# Patient Record
Sex: Female | Born: 1960 | Race: White | Hispanic: No | Marital: Married | Smoking: Never smoker
Health system: Southern US, Community
[De-identification: ages and names within clinical notes are randomized; demographics above are authoritative.]

## PROBLEM LIST (undated history)

## (undated) DIAGNOSIS — B350 Tinea barbae and tinea capitis: Secondary | ICD-10-CM

## (undated) DIAGNOSIS — G4730 Sleep apnea, unspecified: Secondary | ICD-10-CM

## (undated) HISTORY — PX: TUBAL LIGATION: SHX77

## (undated) HISTORY — DX: Sleep apnea, unspecified: G47.30

## (undated) HISTORY — DX: Tinea barbae and tinea capitis: B35.0

---
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
## 2005-11-09 ENCOUNTER — Ambulatory Visit: Payer: Self-pay | Admitting: Unknown Physician Specialty

## 2008-03-10 ENCOUNTER — Ambulatory Visit: Payer: Self-pay | Admitting: Unknown Physician Specialty

## 2008-12-18 LAB — HM COLONOSCOPY: HM Colonoscopy: NORMAL

## 2009-11-27 HISTORY — PX: BREAST SURGERY: SHX581

## 2009-12-24 ENCOUNTER — Ambulatory Visit: Payer: Self-pay | Admitting: Unknown Physician Specialty

## 2011-01-06 ENCOUNTER — Ambulatory Visit: Payer: Self-pay | Admitting: Unknown Physician Specialty

## 2011-01-06 LAB — HM COLONOSCOPY

## 2011-10-19 LAB — HM PAP SMEAR: HM Pap smear: NORMAL

## 2012-11-27 HISTORY — PX: REDUCTION MAMMAPLASTY: SUR839

## 2012-12-18 ENCOUNTER — Ambulatory Visit (INDEPENDENT_AMBULATORY_CARE_PROVIDER_SITE_OTHER): Payer: BC Managed Care – PPO | Admitting: Internal Medicine

## 2012-12-18 ENCOUNTER — Encounter: Payer: Self-pay | Admitting: Internal Medicine

## 2012-12-18 VITALS — BP 118/78 | HR 85 | Temp 97.6°F | Resp 16 | Ht 66.0 in | Wt 142.8 lb

## 2012-12-18 DIAGNOSIS — N951 Menopausal and female climacteric states: Secondary | ICD-10-CM

## 2012-12-18 DIAGNOSIS — Z1331 Encounter for screening for depression: Secondary | ICD-10-CM

## 2012-12-18 DIAGNOSIS — Z860100 Personal history of colon polyps, unspecified: Secondary | ICD-10-CM

## 2012-12-18 DIAGNOSIS — N390 Urinary tract infection, site not specified: Secondary | ICD-10-CM

## 2012-12-18 DIAGNOSIS — Z1239 Encounter for other screening for malignant neoplasm of breast: Secondary | ICD-10-CM

## 2012-12-18 MED ORDER — NITROFURANTOIN MONOHYD MACRO 100 MG PO CAPS: 100.0000 mg | ORAL_CAPSULE | Freq: Two times a day (BID) | ORAL | Status: DC

## 2012-12-18 NOTE — Patient Instructions (Addendum)
Return an untreated urine specimen next tine you have symptoms   Make an appt for a physical with GYN exam and fasting labs same day

## 2012-12-18 NOTE — Progress Notes (Signed)
Patient ID: Latasha Rios, female   DOB: 08-07-1961, 52 y.o.   MRN: [id number]   Patient Active Problem List  Diagnosis  . Perimenopausal symptoms  . Recurrent UTI    Subjective:  CC:   Chief Complaint  Patient presents with  . Establish Care    HPI:   Latasha Rios is a 52 y.o. female who presents as a new patient to establish primary care with the chief complaint of  Recurrent UTIs.  She has had 2 UTIs since  August.  No history of UTIs for generations.  No cultures done .  No changes in soaps, detergents, or bed partners.  Empties bladder (usually) before intercourse. She is perimenopausal,  periods have been irregular, but occurring regularly  for the last 6 months.  some vaginal dryness noted.   2) reated for chostochondral tear (chest Pain, stabbing quality, brought on with supine position,  Occurred after a day of  moving heavy furniture) by [facility].    History reviewed. No pertinent past medical history.  Past Surgical History  Procedure Date  . Breast surgery 2011    Reduction  . Tonsillectomy and adenoidectomy 1965  . Tubal ligation     Family History  Problem Relation Age of Onset  . Hypertension Father   . Heart disease Father   . Hypertension Paternal Grandmother   . Diabetes Paternal Grandmother   . Hypertension Paternal Grandfather   . Diabetes Paternal Grandfather     History   Social History  . Marital Status: Married    Spouse Name: N/A    Number of Children: N/A  . Years of Education: N/A   Occupational History  . Not on file.   Social History Main Topics  . Smoking status: Never Smoker   . Smokeless tobacco: Not on file  . Alcohol Use: No  . Drug Use: No  . Sexually Active:    Other Topics Concern  . Not on file   Social History Narrative  . No narrative on file    No Known Allergies   Review of Systems:  Patient denies headache, fevers, malaise, unintentional weight loss, skin rash, eye pain, sinus congestion  and sinus pain, sore throat, dysphagia,  hemoptysis , cough, dyspnea, wheezing, chest pain, palpitations, orthopnea, edema, abdominal pain, nausea, melena, diarrhea, constipation, flank pain, dysuria, hematuria, urinary  Frequency, nocturia, numbness, tingling, seizures,  Focal weakness, Loss of consciousness,  Tremor, insomnia, depression, anxiety, and suicidal ideation.     Objective:  BP 118/78  Pulse 85  Temp 97.6 F (36.4 C) (Oral)  Resp 16  Ht 5\' 6"  (1.676 m)  Wt 142 lb 12 oz (64.751 kg)  BMI 23.04 kg/m2  SpO2 98%  LMP 12/16/2012  General appearance: alert, cooperative and appears stated age Ears: normal TM's and external ear canals both ears Throat: lips, mucosa, and tongue normal; teeth and gums normal Neck: no adenopathy, no carotid bruit, supple, symmetrical, trachea midline and thyroid not enlarged, symmetric, no tenderness/mass/nodules Back: symmetric, no curvature. ROM normal. No CVA tenderness. Lungs: clear to auscultation bilaterally Heart: regular rate and rhythm, S1, S2 normal, no murmur, click, rub or gallop Abdomen: soft, non-tender; bowel sounds normal; no masses,  no organomegaly Pulses: 2+ and symmetric Skin: Skin color, texture, turgor normal. No rashes or lesions Lymph nodes: Cervical, supraclavicular, and axillary nodes normal.  Assessment and Plan:  Perimenopausal symptoms Not sure if her recent UTIS were due to vaginal dryness since no culture was apparently done, and the second  time she started medication before testing.  Will return for pelvic exam. .   Recurrent UTI Patient given sterile container to collect pre treatment specimen. rx for antibiotic given.    Updated Medication List Outpatient Encounter Prescriptions as of 12/18/2012  Medication Sig Dispense Refill  . nitrofurantoin, macrocrystal-monohydrate, (MACROBID) 100 MG capsule Take 1 capsule (100 mg total) by mouth 2 (two) times daily.  5 capsule  10  . [DISCONTINUED] nitrofurantoin,  macrocrystal-monohydrate, (MACROBID) 100 MG capsule Take 100 mg by mouth 2 (two) times daily.          Orders Placed This Encounter  Procedures  . MM Digital Screening  . HM PAP SMEAR  . HM COLONOSCOPY    No Follow-up on file.

## 2012-12-20 ENCOUNTER — Encounter: Payer: Self-pay | Admitting: Internal Medicine

## 2012-12-20 DIAGNOSIS — Z86010 Personal history of colonic polyps: Secondary | ICD-10-CM | POA: Insufficient documentation

## 2012-12-20 DIAGNOSIS — N951 Menopausal and female climacteric states: Secondary | ICD-10-CM | POA: Insufficient documentation

## 2012-12-20 DIAGNOSIS — N390 Urinary tract infection, site not specified: Secondary | ICD-10-CM | POA: Insufficient documentation

## 2012-12-20 NOTE — Assessment & Plan Note (Signed)
Patient given sterile container to collect pre treatment specimen. rx for antibiotic given.

## 2012-12-20 NOTE — Assessment & Plan Note (Signed)
Not sure if her recent UTIS were due to vaginal dryness since no culture was apparently done, and the second time she started medication before testing.  Will return for pelvic exam. .

## 2013-01-08 ENCOUNTER — Ambulatory Visit: Payer: Self-pay | Admitting: Internal Medicine

## 2013-01-10 LAB — HM MAMMOGRAPHY: HM MAMMO: NORMAL

## 2013-01-29 ENCOUNTER — Encounter: Payer: Self-pay | Admitting: Internal Medicine

## 2013-01-31 ENCOUNTER — Encounter: Payer: BC Managed Care – PPO | Admitting: Internal Medicine

## 2013-02-25 ENCOUNTER — Ambulatory Visit (INDEPENDENT_AMBULATORY_CARE_PROVIDER_SITE_OTHER): Payer: BC Managed Care – PPO | Admitting: Internal Medicine

## 2013-02-25 ENCOUNTER — Encounter: Payer: Self-pay | Admitting: Internal Medicine

## 2013-02-25 ENCOUNTER — Other Ambulatory Visit (HOSPITAL_COMMUNITY)
Admission: RE | Admit: 2013-02-25 | Discharge: 2013-02-25 | Disposition: A | Discharge status: Home / Self Care | Payer: BC Managed Care – PPO | Source: Ambulatory Visit | Attending: Internal Medicine | Admitting: Internal Medicine

## 2013-02-25 VITALS — BP 110/68 | HR 79 | Temp 97.9°F | Ht 66.0 in | Wt 139.0 lb

## 2013-02-25 DIAGNOSIS — Z01419 Encounter for gynecological examination (general) (routine) without abnormal findings: Secondary | ICD-10-CM | POA: Insufficient documentation

## 2013-02-25 DIAGNOSIS — Z0000 Encounter for general adult medical examination without abnormal findings: Secondary | ICD-10-CM

## 2013-02-25 DIAGNOSIS — Z860100 Personal history of colon polyps, unspecified: Secondary | ICD-10-CM

## 2013-02-25 DIAGNOSIS — Z124 Encounter for screening for malignant neoplasm of cervix: Secondary | ICD-10-CM

## 2013-02-25 DIAGNOSIS — N390 Urinary tract infection, site not specified: Secondary | ICD-10-CM

## 2013-02-25 LAB — POCT URINALYSIS DIPSTICK
Protein, UA: NEGATIVE
Spec Grav, UA: >=1.03
Urobilinogen, UA: 0.2

## 2013-02-25 NOTE — Patient Instructions (Addendum)
Your urine test showed no signs of infection, but it was very concentrated (drink more water!)  Return for fasting labs at your leisure (pleaes make appt with front deks so you do not have to wait)

## 2013-02-25 NOTE — Progress Notes (Signed)
Patient ID: Efrata Brunner, female   DOB: February 09, 1961, 52 y.o.   MRN: [id number] Subjective:     Noriko Macari is a 52 y.o. female and is here for a comprehensive physical exam. The patient reports no problems.  History   Social History  . Marital Status: Married    Spouse Name: N/A    Number of Children: N/A  . Years of Education: N/A   Occupational History  . Not on file.   Social History Main Topics  . Smoking status: Never Smoker   . Smokeless tobacco: Not on file  . Alcohol Use: No  . Drug Use: No  . Sexually Active:    Other Topics Concern  . Not on file   Social History Narrative  . No narrative on file   Health Maintenance  Topic Date Due  . Influenza Vaccine  07/28/1960  . Tetanus/tdap  12/18/2013  . Pap Smear  10/18/2014  . Mammogram  01/08/2015  . Colonoscopy  01/06/2021    The following portions of the patient's history were reviewed and updated as appropriate: allergies, current medications, past family history, past medical history, past social history, past surgical history and problem list.  Review of Systems A comprehensive review of systems was negative.   Objective:    BP 110/68  Pulse 79  Temp(Src) 97.9 F (36.6 C) (Oral)  Ht 5\' 6"  (1.676 m)  Wt 139 lb (63.05 kg)  BMI 22.45 kg/m2  SpO2 98%  LMP 02/18/2013  General Appearance:    Alert, cooperative, no distress, appears stated age  Head:    Normocephalic, without obvious abnormality, atraumatic  Eyes:    PERRL, conjunctiva/corneas clear, EOM's intact, fundi    benign, both eyes  Ears:    Normal TM's and external ear canals, both ears  Nose:   Nares normal, septum midline, mucosa normal, no drainage    or sinus tenderness  Throat:   Lips, mucosa, and tongue normal; teeth and gums normal  Neck:   Supple, symmetrical, trachea midline, no adenopathy;    thyroid:  no enlargement/tenderness/nodules; no carotid   bruit or JVD  Back:     Symmetric, no curvature, ROM normal, no CVA  tenderness  Lungs:     Clear to auscultation bilaterally, respirations unlabored  Chest Wall:    No tenderness or deformity   Heart:    Regular rate and rhythm, S1 and S2 normal, no murmur, rub   or gallop  Breast Exam:    No tenderness, masses, or nipple abnormality  Abdomen:     Soft, non-tender, bowel sounds active all four quadrants,    no masses, no organomegaly  Genitalia:    Pelvic: cervix normal in appearance, external genitalia normal, no adnexal masses or tenderness, no cervical motion tenderness, rectovaginal septum normal, uterus normal size, shape, and consistency and vagina normal without discharge  Extremities:   Extremities normal, atraumatic, no cyanosis or edema  Pulses:   2+ and symmetric all extremities  Skin:   Skin color, texture, turgor normal, no rashes or lesions  Lymph nodes:   Cervical, supraclavicular, and axillary nodes normal  Neurologic:   CNII-XII intact, normal strength, sensation and reflexes    throughout     Assessment and Plan  Recurrent UTI She has no signs of vaginal atrophy on exam. She continues to have documented UTIs we will refer her to urology for evaluation.  Routine general medical examination at a health care facility Annual comprehensive exam was done including breast, pelvic  and PAP smear. All screenings have been addressed .    Updated Medication List Outpatient Encounter Prescriptions as of 02/25/2013  Medication Sig Dispense Refill  . nitrofurantoin, macrocrystal-monohydrate, (MACROBID) 100 MG capsule Take 1 capsule (100 mg total) by mouth 2 (two) times daily.  5 capsule  10   No facility-administered encounter medications on file as of 02/25/2013.

## 2013-02-27 ENCOUNTER — Encounter: Payer: Self-pay | Admitting: Internal Medicine

## 2013-02-27 DIAGNOSIS — Z0001 Encounter for general adult medical examination with abnormal findings: Secondary | ICD-10-CM | POA: Insufficient documentation

## 2013-02-27 DIAGNOSIS — Z0000 Encounter for general adult medical examination without abnormal findings: Secondary | ICD-10-CM | POA: Insufficient documentation

## 2013-02-27 NOTE — Assessment & Plan Note (Signed)
Annual comprehensive exam was done including breast, pelvic and PAP smear. All screenings have been addressed .  

## 2013-02-27 NOTE — Assessment & Plan Note (Signed)
She has no signs of vaginal atrophy on exam. She continues to have documented UTIs we will refer her to urology for evaluation.

## 2013-03-03 ENCOUNTER — Encounter: Payer: Self-pay | Admitting: General Practice

## 2013-03-12 ENCOUNTER — Telehealth: Payer: Self-pay | Admitting: *Deleted

## 2013-03-12 DIAGNOSIS — Z13220 Encounter for screening for lipoid disorders: Secondary | ICD-10-CM

## 2013-03-12 DIAGNOSIS — R5381 Other malaise: Secondary | ICD-10-CM

## 2013-03-12 NOTE — Telephone Encounter (Signed)
Pt is coming in for labs tomorrow 04.17.2014, what labs and dx code would you like?

## 2013-03-12 NOTE — Telephone Encounter (Signed)
As I was ordering patient's labs for tomorrows lab visit, I noticed taht she has not read the [employer] message I sent  Last week or so that her PAP smear was normal.  Please let her know the results and remind her that I will send her lab results same way

## 2013-03-13 ENCOUNTER — Other Ambulatory Visit: Payer: BC Managed Care – PPO

## 2013-03-27 ENCOUNTER — Other Ambulatory Visit (INDEPENDENT_AMBULATORY_CARE_PROVIDER_SITE_OTHER): Payer: BC Managed Care – PPO

## 2013-03-27 DIAGNOSIS — R5381 Other malaise: Secondary | ICD-10-CM

## 2013-03-27 DIAGNOSIS — Z13220 Encounter for screening for lipoid disorders: Secondary | ICD-10-CM

## 2013-03-27 DIAGNOSIS — R5383 Other fatigue: Secondary | ICD-10-CM

## 2013-03-27 LAB — COMPREHENSIVE METABOLIC PANEL
ALT: 20 U/L (ref 0–35)
Albumin: 4 g/dL (ref 3.5–5.2)
Alkaline Phosphatase: 64 U/L (ref 39–117)
CO2: 28 mEq/L (ref 19–32)
GFR: 110.99 mL/min (ref >60.00)
Glucose, Bld: 83 mg/dL (ref 70–99)
Potassium: 4.1 mEq/L (ref 3.5–5.1)
Sodium: 134 mEq/L — ABNORMAL LOW (ref 135–145)
Total Bilirubin: 0.8 mg/dL (ref 0.3–1.2)
Total Protein: 7.1 g/dL (ref 6.0–8.3)

## 2013-03-27 LAB — LIPID PANEL
Cholesterol: 214 mg/dL — ABNORMAL HIGH (ref 0–200)
HDL: 39.9 mg/dL
Total CHOL/HDL Ratio: 5
Triglycerides: 86 mg/dL (ref 0.0–149.0)
VLDL: 17.2 mg/dL (ref 0.0–40.0)

## 2013-03-27 LAB — CBC WITH DIFFERENTIAL/PLATELET
Basophils Absolute: 0 K/uL (ref 0.0–0.1)
Basophils Relative: 0.4 % (ref 0.0–3.0)
Eosinophils Absolute: 0.1 K/uL (ref 0.0–0.7)
Eosinophils Relative: 2.2 % (ref 0.0–5.0)
HCT: 39.1 % (ref 36.0–46.0)
Hemoglobin: 13.5 g/dL (ref 12.0–15.0)
Lymphocytes Relative: 24.4 % (ref 12.0–46.0)
Lymphs Abs: 1.5 K/uL (ref 0.7–4.0)
MCHC: 34.6 g/dL (ref 30.0–36.0)
MCV: 92.9 fl (ref 78.0–100.0)
Monocytes Absolute: 0.7 K/uL (ref 0.1–1.0)
Monocytes Relative: 11 % (ref 3.0–12.0)
Neutro Abs: 3.8 K/uL (ref 1.4–7.7)
Neutrophils Relative %: 62 % (ref 43.0–77.0)
Platelets: 349 K/uL (ref 150.0–400.0)
RBC: 4.21 Mil/uL (ref 3.87–5.11)
RDW: 12.7 % (ref 11.5–14.6)
WBC: 6.1 K/uL (ref 4.5–10.5)

## 2013-03-27 LAB — TSH: TSH: 0.37 u[IU]/mL (ref 0.35–5.50)

## 2013-03-28 ENCOUNTER — Encounter: Payer: Self-pay | Admitting: Internal Medicine

## 2013-03-31 ENCOUNTER — Telehealth: Payer: Self-pay | Admitting: *Deleted

## 2013-03-31 NOTE — Telephone Encounter (Signed)
Unread MyChart Message: "Your recent labs were all normal,. Including thyroid function. Your cholesterol Was borderline, (LDl was a little high at 169) but in the absence of other risk factors for heart disease I do no advocate medication at this time , just regular exercise, a baby aspirin daily , increase your intake of vegetables and repeat in 6 months"  Called and notified pt of results.

## 2013-04-15 LAB — HM PAP SMEAR: HM Pap smear: NORMAL

## 2013-10-02 ENCOUNTER — Other Ambulatory Visit: Payer: Self-pay

## 2014-03-16 ENCOUNTER — Encounter: Payer: Self-pay | Admitting: Internal Medicine

## 2014-03-16 ENCOUNTER — Ambulatory Visit (INDEPENDENT_AMBULATORY_CARE_PROVIDER_SITE_OTHER): Payer: BC Managed Care – PPO | Admitting: Internal Medicine

## 2014-03-16 VITALS — BP 122/78 | HR 96 | Temp 98.2°F | Resp 18 | Ht 66.9 in | Wt 146.5 lb

## 2014-03-16 DIAGNOSIS — Z0000 Encounter for general adult medical examination without abnormal findings: Secondary | ICD-10-CM

## 2014-03-16 DIAGNOSIS — R5381 Other malaise: Secondary | ICD-10-CM

## 2014-03-16 DIAGNOSIS — E559 Vitamin D deficiency, unspecified: Secondary | ICD-10-CM

## 2014-03-16 DIAGNOSIS — Z23 Encounter for immunization: Secondary | ICD-10-CM

## 2014-03-16 DIAGNOSIS — Z1239 Encounter for other screening for malignant neoplasm of breast: Secondary | ICD-10-CM

## 2014-03-16 DIAGNOSIS — R5383 Other fatigue: Secondary | ICD-10-CM

## 2014-03-16 DIAGNOSIS — E785 Hyperlipidemia, unspecified: Secondary | ICD-10-CM

## 2014-03-16 NOTE — Patient Instructions (Signed)
You had your annual  wellness exam today.  Your next PAP is due in 2017  [facility]     You received the Tetanus vaccine today.   Please return in 3 months for fasting lipids

## 2014-03-16 NOTE — Progress Notes (Signed)
Pre-visit discussion using our clinic review tool. No additional management support is needed unless otherwise documented below in the visit note.  

## 2014-03-17 NOTE — Progress Notes (Signed)
Patient ID: Latasha Rios, female   DOB: 12/26/1960, 53 y.o.   MRN: [id number]  Subjective:     Latasha Rios is a 53 y.o. female and is here for a comprehensive physical exam. The patient reports no problems.  History   Social History  . Marital Status: Married    Spouse Name: N/A    Number of Children: N/A  . Years of Education: N/A   Occupational History  . Not on file.   Social History Main Topics  . Smoking status: Never Smoker   . Smokeless tobacco: Not on file  . Alcohol Use: No  . Drug Use: No  . Sexual Activity: Yes   Other Topics Concern  . Not on file   Social History Narrative  . No narrative on file   Health Maintenance  Topic Date Due  . Influenza Vaccine  06/27/2014  . Mammogram  01/10/2015  . Pap Smear  04/15/2016  . Colonoscopy  01/06/2021  . Tetanus/tdap  03/16/2024    The following portions of the patient's history were reviewed and updated as appropriate: allergies, current medications, past family history, past medical history, past social history, past surgical history and problem list.  Review of Systems A comprehensive review of systems was negative.   Objective:   BP 122/78  Pulse 96  Temp(Src) 98.2 F (36.8 C) (Oral)  Resp 18  Ht 5' 6.9" (1.699 m)  Wt 146 lb 8 oz (66.452 kg)  BMI 23.02 kg/m2  SpO2 98%  LMP 03/21/2013  General appearance: alert, cooperative and appears stated age Head: Normocephalic, without obvious abnormality, atraumatic Eyes: conjunctivae/corneas clear. PERRL, EOM's intact. Fundi benign. Ears: normal TM's and external ear canals both ears Nose: Nares normal. Septum midline. Mucosa normal. No drainage or sinus tenderness. Throat: lips, mucosa, and tongue normal; teeth and gums normal Neck: no adenopathy, no carotid bruit, no JVD, supple, symmetrical, trachea midline and thyroid not enlarged, symmetric, no tenderness/mass/nodules Lungs: clear to auscultation bilaterally Breasts: normal appearance, no masses  or tenderness Heart: regular rate and rhythm, S1, S2 normal, no murmur, click, rub or gallop Abdomen: soft, non-tender; bowel sounds normal; no masses,  no organomegaly Extremities: extremities normal, atraumatic, no cyanosis or edema Pulses: 2+ and symmetric Skin: Skin color, texture, turgor normal. No rashes or lesions Neurologic: Alert and oriented X 3, normal strength and tone. Normal symmetric reflexes. Normal coordination and gait.    Assessment and Plan:    Routine general medical examination at a health care facility Annual comprehensive exam was done including breast,excluding   pelvic and PAP smear. All screenings have been addressed .    Updated Medication List Outpatient Encounter Prescriptions as of 03/16/2014  Medication Sig  . nitrofurantoin, macrocrystal-monohydrate, (MACROBID) 100 MG capsule Take 1 capsule (100 mg total) by mouth 2 (two) times daily.

## 2014-03-17 NOTE — Assessment & Plan Note (Signed)
Annual comprehensive exam was done including breast, excluding pelvic and PAP smear. All screenings have been addressed .  

## 2014-03-18 ENCOUNTER — Encounter: Payer: Self-pay | Admitting: Emergency Medicine

## 2014-03-30 ENCOUNTER — Ambulatory Visit
Admission: RE | Admit: 2014-03-30 | Discharge: 2014-03-30 | Disposition: A | Discharge status: Home / Self Care | Payer: BC Managed Care – PPO | Source: Ambulatory Visit | Attending: Internal Medicine | Admitting: Internal Medicine

## 2014-03-30 DIAGNOSIS — Z1239 Encounter for other screening for malignant neoplasm of breast: Secondary | ICD-10-CM

## 2014-04-01 ENCOUNTER — Encounter: Payer: Self-pay | Admitting: Internal Medicine

## 2014-06-15 ENCOUNTER — Other Ambulatory Visit: Payer: BC Managed Care – PPO

## 2014-06-29 ENCOUNTER — Other Ambulatory Visit (INDEPENDENT_AMBULATORY_CARE_PROVIDER_SITE_OTHER): Payer: BC Managed Care – PPO

## 2014-06-29 DIAGNOSIS — R5381 Other malaise: Secondary | ICD-10-CM

## 2014-06-29 DIAGNOSIS — E559 Vitamin D deficiency, unspecified: Secondary | ICD-10-CM

## 2014-06-29 DIAGNOSIS — R5383 Other fatigue: Principal | ICD-10-CM

## 2014-06-29 DIAGNOSIS — E785 Hyperlipidemia, unspecified: Secondary | ICD-10-CM

## 2014-06-29 LAB — COMPREHENSIVE METABOLIC PANEL
ALBUMIN: 4 g/dL (ref 3.5–5.2)
ALK PHOS: 51 U/L (ref 39–117)
ALT: 15 U/L (ref 0–35)
AST: 17 U/L (ref 0–37)
BUN: 14 mg/dL (ref 6–23)
CO2: 27 mEq/L (ref 19–32)
Calcium: 8.6 mg/dL (ref 8.4–10.5)
Chloride: 102 mEq/L (ref 96–112)
Creatinine, Ser: 0.5 mg/dL (ref 0.4–1.2)
GFR: 130.3 mL/min (ref >60.00)
Glucose, Bld: 89 mg/dL (ref 70–99)
POTASSIUM: 3.9 meq/L (ref 3.5–5.1)
SODIUM: 136 meq/L (ref 135–145)
TOTAL PROTEIN: 7.3 g/dL (ref 6.0–8.3)
Total Bilirubin: 0.5 mg/dL (ref 0.2–1.2)

## 2014-06-29 LAB — CBC WITH DIFFERENTIAL/PLATELET
Basophils Absolute: 0 10*3/uL (ref 0.0–0.1)
Basophils Relative: 0.4 % (ref 0.0–3.0)
EOS ABS: 0.2 10*3/uL (ref 0.0–0.7)
Eosinophils Relative: 3 % (ref 0.0–5.0)
HCT: 40.6 % (ref 36.0–46.0)
Hemoglobin: 13.8 g/dL (ref 12.0–15.0)
Lymphocytes Relative: 27.3 % (ref 12.0–46.0)
Lymphs Abs: 1.7 10*3/uL (ref 0.7–4.0)
MCHC: 33.9 g/dL (ref 30.0–36.0)
MCV: 94.2 fl (ref 78.0–100.0)
Monocytes Absolute: 0.6 10*3/uL (ref 0.1–1.0)
Monocytes Relative: 10.1 % (ref 3.0–12.0)
NEUTROS PCT: 59.2 % (ref 43.0–77.0)
Neutro Abs: 3.7 10*3/uL (ref 1.4–7.7)
PLATELETS: 339 10*3/uL (ref 150.0–400.0)
RBC: 4.31 Mil/uL (ref 3.87–5.11)
RDW: 12.9 % (ref 11.5–15.5)
WBC: 6.3 10*3/uL (ref 4.0–10.5)

## 2014-06-29 LAB — TSH: TSH: 0.68 u[IU]/mL (ref 0.35–4.50)

## 2014-06-29 LAB — LIPID PANEL
CHOL/HDL RATIO: 5
Cholesterol: 205 mg/dL — ABNORMAL HIGH (ref 0–200)
HDL: 38.6 mg/dL — AB (ref >39.00)
LDL CALC: 151 mg/dL — AB (ref 0–99)
NonHDL: 166.4
Triglycerides: 78 mg/dL (ref 0.0–149.0)
VLDL: 15.6 mg/dL (ref 0.0–40.0)

## 2014-07-01 ENCOUNTER — Encounter: Payer: Self-pay | Admitting: Internal Medicine

## 2014-07-20 ENCOUNTER — Telehealth: Payer: Self-pay | Admitting: Internal Medicine

## 2014-07-20 NOTE — Telephone Encounter (Signed)
FYI

## 2014-07-20 NOTE — Telephone Encounter (Signed)
Patient Information:  Caller Name: Marasia  Phone: [phone]  Patient: Latasha Rios, Latasha Rios  Gender: Female  DOB: May 22, 1961  Age: 53 Years  PCP: Duncan Dull (Adults only)  Pregnant: No  Office Follow Up:  Does the office need to follow up with this patient?: No  Instructions For The Office: N/A  RN Note:  Mild vertigo present today 07/20/14 but not at time of call.  [place] and lasts about 10 days.  Reviewed some reasons for temporary dizziness per guideline.  Suggested she remain hydrated, rest if symptoms occur again. Call back reasons reviewed.   Advised to see provider within 2 weeks for dizziness not present now but is a chronic symptom per Dizziness. Prefers to call back to scheduled appointment.   Symptoms  Reason For Call & Symptoms: Vertigo episodes. Vertigo first occurred in July while [place] mountains. Vertigo recurred 07/19/14 while in mountains and still present.  Reviewed Health History In EMR: Yes  Reviewed Medications In EMR: Yes  Reviewed Allergies In EMR: Yes  Reviewed Surgeries / Procedures: Yes  Date of Onset of Symptoms: 05/31/2014 OB / GYN:  LMP: 07/10/2014  Guideline(s) Used:  Dizziness  Disposition Per Guideline:   See Within 2 Weeks in Office  Reason For Disposition Reached:   Dizziness not present now, but is a chronic symptom (recurrent or ongoing AND lasting > 4 weeks)  Advice Given:  N/A  Patient Will Follow Care Advice:  YES

## 2015-05-27 ENCOUNTER — Ambulatory Visit (INDEPENDENT_AMBULATORY_CARE_PROVIDER_SITE_OTHER): Payer: BLUE CROSS/BLUE SHIELD | Admitting: Internal Medicine

## 2015-05-27 ENCOUNTER — Encounter: Payer: Self-pay | Admitting: Internal Medicine

## 2015-05-27 VITALS — BP 120/90 | HR 82 | Temp 98.3°F | Resp 14 | Ht 66.25 in | Wt 143.0 lb

## 2015-05-27 DIAGNOSIS — E559 Vitamin D deficiency, unspecified: Secondary | ICD-10-CM

## 2015-05-27 DIAGNOSIS — Z1159 Encounter for screening for other viral diseases: Secondary | ICD-10-CM | POA: Diagnosis not present

## 2015-05-27 DIAGNOSIS — Z0000 Encounter for general adult medical examination without abnormal findings: Secondary | ICD-10-CM

## 2015-05-27 DIAGNOSIS — R5383 Other fatigue: Secondary | ICD-10-CM | POA: Diagnosis not present

## 2015-05-27 DIAGNOSIS — E785 Hyperlipidemia, unspecified: Secondary | ICD-10-CM

## 2015-05-27 DIAGNOSIS — Z1239 Encounter for other screening for malignant neoplasm of breast: Secondary | ICD-10-CM

## 2015-05-27 LAB — COMPREHENSIVE METABOLIC PANEL
ALT: 15 U/L (ref 0–35)
AST: 18 U/L (ref 0–37)
Albumin: 4.3 g/dL (ref 3.5–5.2)
Alkaline Phosphatase: 69 U/L (ref 39–117)
BUN: 13 mg/dL (ref 6–23)
CALCIUM: 9.6 mg/dL (ref 8.4–10.5)
CO2: 31 mEq/L (ref 19–32)
Chloride: 104 mEq/L (ref 96–112)
Creatinine, Ser: 0.61 mg/dL (ref 0.40–1.20)
GFR: 108.02 mL/min (ref >60.00)
Glucose, Bld: 89 mg/dL (ref 70–99)
POTASSIUM: 3.9 meq/L (ref 3.5–5.1)
SODIUM: 141 meq/L (ref 135–145)
Total Bilirubin: 0.8 mg/dL (ref 0.2–1.2)
Total Protein: 7.5 g/dL (ref 6.0–8.3)

## 2015-05-27 LAB — LIPID PANEL
CHOL/HDL RATIO: 5
CHOLESTEROL: 206 mg/dL — AB (ref 0–200)
HDL: 38 mg/dL — ABNORMAL LOW (ref >39.00)
LDL Cholesterol: 151 mg/dL — ABNORMAL HIGH (ref 0–99)
NonHDL: 168
Triglycerides: 85 mg/dL (ref 0.0–149.0)
VLDL: 17 mg/dL (ref 0.0–40.0)

## 2015-05-27 LAB — VITAMIN D 25 HYDROXY (VIT D DEFICIENCY, FRACTURES): VITD: 22.96 ng/mL — ABNORMAL LOW (ref 30.00–100.00)

## 2015-05-27 LAB — TSH: TSH: 0.95 u[IU]/mL (ref 0.35–4.50)

## 2015-05-27 NOTE — Progress Notes (Signed)
Pre-visit discussion using our clinic review tool. No additional management support is needed unless otherwise documented below in the visit note.  

## 2015-05-27 NOTE — Progress Notes (Signed)
Patient ID: Latasha Rios, female    DOB: [birth year]  Age: 54 y.o. MRN: [id number]  The patient is here for annual  wellness examination and management of other chronic and acute problems.  She is perimenopausal, and has had only 2 menses since Christmas.  There is no  Risk of unwanted pregnancy as she is s/p tubal ligation in 1993.    The risk factors are reflected in the social history.  The roster of all physicians providing medical care to patient - is listed in the Snapshot section of the chart.  Activities of daily living:  The patient is 100% independent in all ADLs: dressing, toileting, feeding as well as independent mobility  Home safety : The patient has smoke detectors in the home. They wear seatbelts.  There are no firearms at home. There is no violence in the home.   There is no risks for hepatitis, STDs or HIV. There is no   history of blood transfusion. They have no travel history to infectious disease endemic areas of the world.  The patient has seen their dentist in the last six month. They have seen their eye doctor in the last year. They admit to slight hearing difficulty with regard to whispered voices and some television programs.  They have deferred audiologic testing in the last year.  They do not  have excessive sun exposure. Discussed the need for sun protection: hats, long sleeves and use of sunscreen if there is significant sun exposure.   Diet: the importance of a healthy diet is discussed. They do have a healthy diet.  The benefits of regular aerobic exercise were discussed. She walks 4 times per week ,  20 minutes.   Depression screen: there are no signs or vegative symptoms of depression- irritability, change in appetite, anhedonia, sadness/tearfullness.  Cognitive assessment: the patient manages all their financial and personal affairs and is actively engaged. They could relate day,date,year and events; recalled 2/3 objects at 3 minutes; performed  clock-face test normally.  The following portions of the patient's history were reviewed and updated as appropriate: allergies, current medications, past family history, past medical history,  past surgical history, past social history  and problem list.  Visual acuity was not assessed per patient preference since she has regular follow up with her ophthalmologist. Hearing and body mass index were assessed and reviewed.   During the course of the visit the patient was educated and counseled about appropriate screening and preventive services including : fall prevention , diabetes screening, nutrition counseling, colorectal cancer screening, and recommended immunizations.    CC: The primary encounter diagnosis was Breast cancer screening. Diagnoses of Need for hepatitis C screening test, Vitamin D deficiency, Hyperlipidemia, Other fatigue, and Visit for preventive health examination were also pertinent to this visit.  History Latasha Rios has no past medical history on file.   She has past surgical history that includes Breast surgery (2011); Tonsillectomy and adenoidectomy (1965); and Tubal ligation.   Her family history includes Cancer in her mother; Diabetes in her paternal grandfather and paternal grandmother; Heart disease in her father; Hypertension in her father, paternal grandfather, and paternal grandmother.She reports that she has never smoked. She does not have any smokeless tobacco history on file. She reports that she does not drink alcohol or use illicit drugs.  Outpatient Prescriptions Prior to Visit  Medication Sig Dispense Refill  . nitrofurantoin, macrocrystal-monohydrate, (MACROBID) 100 MG capsule Take 1 capsule (100 mg total) by mouth 2 (two) times daily. 5 capsule  10   No facility-administered medications prior to visit.    Review of Systems   Patient denies headache, fevers, malaise, unintentional weight loss, skin rash, eye pain, sinus congestion and sinus pain, sore throat,  dysphagia,  hemoptysis , cough, dyspnea, wheezing, chest pain, palpitations, orthopnea, edema, abdominal pain, nausea, melena, diarrhea, constipation, flank pain, dysuria, hematuria, urinary  Frequency, nocturia, numbness, tingling, seizures,  Focal weakness, Loss of consciousness,  Tremor, insomnia, depression, anxiety, and suicidal ideation.      Objective:  BP 120/90 mmHg  Pulse 82  Temp(Src) 98.3 F (36.8 C) (Oral)  Resp 14  Ht 5' 6.25" (1.683 m)  Wt 143 lb (64.864 kg)  BMI 22.90 kg/m2  SpO2 98%  LMP 12/28/2014 (Approximate)  Physical Exam  General appearance: alert, cooperative and appears stated age Head: Normocephalic, without obvious abnormality, atraumatic Eyes: conjunctivae/corneas clear. PERRL, EOM's intact. Fundi benign. Ears: normal TM's and external ear canals both ears Nose: Nares normal. Septum midline. Mucosa normal. No drainage or sinus tenderness. Throat: lips, mucosa, and tongue normal; teeth and gums normal Neck: no adenopathy, no carotid bruit, no JVD, supple, symmetrical, trachea midline and thyroid not enlarged, symmetric, no tenderness/mass/nodules Lungs: clear to auscultation bilaterally Breasts: normal appearance, no masses or tenderness Heart: regular rate and rhythm, S1, S2 normal, no murmur, click, rub or gallop Abdomen: soft, non-tender; bowel sounds normal; no masses,  no organomegaly Extremities: extremities normal, atraumatic, no cyanosis or edema Pulses: 2+ and symmetric Skin: Skin color, texture, turgor normal. No rashes or lesions Neurologic: Alert and oriented X 3, normal strength and tone. Normal symmetric reflexes. Normal coordination and gait.    Assessment & Plan:   Problem List Items Addressed This Visit      Unprioritized   Visit for preventive health examination    Annual wellness  exam was done as well as a comprehensive physical exam  .  During the course of the visit the patient was educated and counseled about appropriate  screening and preventive services and screenings were brought up to date for cervical and breast cancer .  She will return for fasting labs to provide samples for diabetes screening and lipid analysis with projected  10 year  risk for CAD. nutrition counseling, skin cancer screening has been recommended, along with review of the age appropriate recommended immunizations.  Printed recommendations for health maintenance screenings was given.   Lab Results  Component Value Date   TSH 0.95 05/27/2015   Lab Results  Component Value Date   CHOL 206* 05/27/2015   HDL 38.00* 05/27/2015   LDLCALC 151* 05/27/2015   LDLDIRECT 169.5 03/27/2013   TRIG 85.0 05/27/2015   CHOLHDL 5 05/27/2015          Vitamin D deficiency   Relevant Orders   Vit D  25 hydroxy (rtn osteoporosis monitoring) (Completed)    Other Visit Diagnoses    Breast cancer screening    -  Primary    Relevant Orders    MM DIGITAL SCREENING BILATERAL    Need for hepatitis C screening test        Relevant Orders    Hepatitis C antibody (Completed)    Hyperlipidemia        Relevant Orders    Lipid panel (Completed)    Other fatigue        Relevant Orders    Comprehensive metabolic panel (Completed)    TSH (Completed)       I have discontinued Ms. Daza's nitrofurantoin (macrocrystal-monohydrate). I am  also having her start on ergocalciferol.  Meds ordered this encounter  Medications  . ergocalciferol (DRISDOL) 50000 UNITS capsule    Sig: Take 1 capsule (50,000 Units total) by mouth once a week.    Dispense:  4 capsule    Refill:  0    Medications Discontinued During This Encounter  Medication Reason  . nitrofurantoin, macrocrystal-monohydrate, (MACROBID) 100 MG capsule Completed Course    Follow-up: Return in about 4 weeks (around 06/24/2015).   Sherlene Shams, MD

## 2015-05-27 NOTE — Patient Instructions (Signed)

## 2015-05-28 ENCOUNTER — Encounter: Payer: Self-pay | Admitting: Internal Medicine

## 2015-05-28 DIAGNOSIS — E559 Vitamin D deficiency, unspecified: Secondary | ICD-10-CM | POA: Insufficient documentation

## 2015-05-28 LAB — HEPATITIS C ANTIBODY: HCV Ab: NEGATIVE

## 2015-05-28 MED ORDER — ERGOCALCIFEROL 1.25 MG (50000 UT) PO CAPS: 50000.0000 [IU] | ORAL_CAPSULE | ORAL | Status: DC

## 2015-05-29 NOTE — Assessment & Plan Note (Addendum)
Annual wellness  exam was done as well as a comprehensive physical exam  .  During the course of the visit the patient was educated and counseled about appropriate screening and preventive services and screenings were brought up to date for cervical and breast cancer .  She will return for fasting labs to provide samples for diabetes screening and lipid analysis with projected  10 year  risk for CAD. nutrition counseling, skin cancer screening has been recommended, along with review of the age appropriate recommended immunizations.  Printed recommendations for health maintenance screenings was given.   Lab Results  Component Value Date   TSH 0.95 05/27/2015   Lab Results  Component Value Date   CHOL 206* 05/27/2015   HDL 38.00* 05/27/2015   LDLCALC 151* 05/27/2015   LDLDIRECT 169.5 03/27/2013   TRIG 85.0 05/27/2015   CHOLHDL 5 05/27/2015

## 2015-06-30 ENCOUNTER — Telehealth: Payer: Self-pay

## 2015-06-30 NOTE — Telephone Encounter (Signed)
Left message for patient to return my call regarding her nurse visit.  Awaiting call back.

## 2016-03-22 DIAGNOSIS — R0683 Snoring: Secondary | ICD-10-CM | POA: Diagnosis not present

## 2016-03-22 DIAGNOSIS — G4733 Obstructive sleep apnea (adult) (pediatric): Secondary | ICD-10-CM | POA: Diagnosis not present

## 2016-04-27 ENCOUNTER — Ambulatory Visit: Payer: BLUE CROSS/BLUE SHIELD | Attending: Otolaryngology

## 2016-04-27 DIAGNOSIS — G4733 Obstructive sleep apnea (adult) (pediatric): Secondary | ICD-10-CM | POA: Insufficient documentation

## 2016-04-27 DIAGNOSIS — R0683 Snoring: Secondary | ICD-10-CM | POA: Diagnosis not present

## 2016-05-19 DIAGNOSIS — G4733 Obstructive sleep apnea (adult) (pediatric): Secondary | ICD-10-CM | POA: Diagnosis not present

## 2016-05-31 ENCOUNTER — Ambulatory Visit (INDEPENDENT_AMBULATORY_CARE_PROVIDER_SITE_OTHER): Payer: BLUE CROSS/BLUE SHIELD | Admitting: Internal Medicine

## 2016-05-31 ENCOUNTER — Encounter: Payer: Self-pay | Admitting: Internal Medicine

## 2016-05-31 ENCOUNTER — Other Ambulatory Visit (HOSPITAL_COMMUNITY)
Admission: RE | Admit: 2016-05-31 | Discharge: 2016-05-31 | Disposition: A | Discharge status: Home / Self Care | Payer: BLUE CROSS/BLUE SHIELD | Source: Ambulatory Visit | Attending: Internal Medicine | Admitting: Internal Medicine

## 2016-05-31 VITALS — BP 120/84 | HR 81 | Temp 97.8°F | Resp 12 | Ht 66.0 in | Wt 145.0 lb

## 2016-05-31 DIAGNOSIS — E559 Vitamin D deficiency, unspecified: Secondary | ICD-10-CM

## 2016-05-31 DIAGNOSIS — Z124 Encounter for screening for malignant neoplasm of cervix: Secondary | ICD-10-CM | POA: Diagnosis not present

## 2016-05-31 DIAGNOSIS — Z1239 Encounter for other screening for malignant neoplasm of breast: Secondary | ICD-10-CM

## 2016-05-31 DIAGNOSIS — Z1159 Encounter for screening for other viral diseases: Secondary | ICD-10-CM | POA: Diagnosis not present

## 2016-05-31 DIAGNOSIS — Z01419 Encounter for gynecological examination (general) (routine) without abnormal findings: Secondary | ICD-10-CM | POA: Insufficient documentation

## 2016-05-31 DIAGNOSIS — Z0000 Encounter for general adult medical examination without abnormal findings: Secondary | ICD-10-CM

## 2016-05-31 DIAGNOSIS — Z1151 Encounter for screening for human papillomavirus (HPV): Secondary | ICD-10-CM | POA: Insufficient documentation

## 2016-05-31 DIAGNOSIS — E785 Hyperlipidemia, unspecified: Secondary | ICD-10-CM

## 2016-05-31 DIAGNOSIS — G4733 Obstructive sleep apnea (adult) (pediatric): Secondary | ICD-10-CM

## 2016-05-31 DIAGNOSIS — R5383 Other fatigue: Secondary | ICD-10-CM

## 2016-05-31 LAB — CBC WITH DIFFERENTIAL/PLATELET
BASOS ABS: 0 10*3/uL (ref 0.0–0.1)
BASOS PCT: 0.4 % (ref 0.0–3.0)
EOS ABS: 0.2 10*3/uL (ref 0.0–0.7)
Eosinophils Relative: 2.3 % (ref 0.0–5.0)
HEMATOCRIT: 40.7 % (ref 36.0–46.0)
Hemoglobin: 13.8 g/dL (ref 12.0–15.0)
LYMPHS PCT: 23.9 % (ref 12.0–46.0)
Lymphs Abs: 1.6 10*3/uL (ref 0.7–4.0)
MCHC: 33.8 g/dL (ref 30.0–36.0)
MCV: 91.8 fl (ref 78.0–100.0)
MONO ABS: 0.8 10*3/uL (ref 0.1–1.0)
Monocytes Relative: 11 % (ref 3.0–12.0)
NEUTROS ABS: 4.3 10*3/uL (ref 1.4–7.7)
NEUTROS PCT: 62.4 % (ref 43.0–77.0)
PLATELETS: 328 10*3/uL (ref 150.0–400.0)
RBC: 4.43 Mil/uL (ref 3.87–5.11)
RDW: 12.9 % (ref 11.5–15.5)
WBC: 6.8 10*3/uL (ref 4.0–10.5)

## 2016-05-31 LAB — LIPID PANEL
CHOLESTEROL: 226 mg/dL — AB (ref 0–200)
HDL: 46.4 mg/dL (ref >39.00)
LDL Cholesterol: 166 mg/dL — ABNORMAL HIGH (ref 0–99)
NonHDL: 179.89
TRIGLYCERIDES: 67 mg/dL (ref 0.0–149.0)
Total CHOL/HDL Ratio: 5
VLDL: 13.4 mg/dL (ref 0.0–40.0)

## 2016-05-31 LAB — COMPREHENSIVE METABOLIC PANEL
ALBUMIN: 4.5 g/dL (ref 3.5–5.2)
ALT: 21 U/L (ref 0–35)
AST: 19 U/L (ref 0–37)
Alkaline Phosphatase: 66 U/L (ref 39–117)
BILIRUBIN TOTAL: 0.3 mg/dL (ref 0.2–1.2)
BUN: 15 mg/dL (ref 6–23)
CO2: 31 mEq/L (ref 19–32)
Calcium: 9.6 mg/dL (ref 8.4–10.5)
Chloride: 104 mEq/L (ref 96–112)
Creatinine, Ser: 0.6 mg/dL (ref 0.40–1.20)
GFR: 109.7 mL/min (ref >60.00)
GLUCOSE: 91 mg/dL (ref 70–99)
Potassium: 4.3 mEq/L (ref 3.5–5.1)
Sodium: 139 mEq/L (ref 135–145)
TOTAL PROTEIN: 7.6 g/dL (ref 6.0–8.3)

## 2016-05-31 LAB — TSH: TSH: 0.61 u[IU]/mL (ref 0.35–4.50)

## 2016-05-31 LAB — VITAMIN D 25 HYDROXY (VIT D DEFICIENCY, FRACTURES): VITD: 24.5 ng/mL — AB (ref 30.00–100.00)

## 2016-05-31 LAB — LDL CHOLESTEROL, DIRECT: Direct LDL: 193 mg/dL

## 2016-05-31 NOTE — Patient Instructions (Signed)

## 2016-05-31 NOTE — Progress Notes (Signed)
Patient ID: Latasha Rios, female    DOB: 06-28-1961  Age: 55 y.o. MRN: [id number]  The patient is here for annual GYN/preventive examination and management of other chronic and acute problems.  Last PAP 2014 negative Mammogram May 2015, ordered June 2016 but not done \\hyperplastic  polyps 2012 Latasha Rios )  OSA confirmed by sleep study June 2017  5 cm H20 optimal pressure  Mother died around  [place].  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  Does not have trouble falling asleep,  Snores.     The risk factors are reflected in the social history.  The roster of all physicians providing medical care to patient - is listed in the Snapshot section of the chart. Home safety : The patient has smoke detectors in the home. They wear seatbelts.  There are no firearms at home. There is no violence in the home.   There is no risks for hepatitis, STDs or HIV. There is no   history of blood transfusion. They have no travel history to infectious disease endemic areas of the world.  The patient has seen their dentist in the last six month. They have seen their eye doctor in the last year. They admit to slight hearing difficulty with regard to whispered voices and some television programs.  They have deferred audiologic testing in the last year.  They do not  have excessive sun exposure. Discussed the need for sun protection: hats, long sleeves and use of sunscreen if there is significant sun exposure.   Diet: the importance of a healthy diet is discussed. They do have a healthy diet.  The benefits of regular aerobic exercise were discussed. She walks 4 times per week ,  20 minutes.   Depression screen: there are no signs or vegative symptoms of depression- irritability, change in appetite, anhedonia, sadness/tearfullness.  The following portions of the patient's history were reviewed and updated as appropriate: allergies, current medications, past family history, past medical  history,  past surgical history, past social history  and problem list.  Visual acuity was not assessed per patient preference since she has regular follow up with her ophthalmologist. Hearing and body mass index were assessed and reviewed.   During the course of the visit the patient was educated and counseled about appropriate screening and preventive services including : fall prevention , diabetes screening, nutrition counseling, colorectal cancer screening, and recommended immunizations.    CC: The primary encounter diagnosis was Vitamin D deficiency. Diagnoses of Cervical cancer screening, Hyperlipidemia, Need for hepatitis C screening test, Other fatigue, Breast cancer screening, Visit for preventive health examination, and OSA (obstructive sleep apnea) were also pertinent to this visit.  History Latasha Rios has no past medical history on file.   She has past surgical history that includes Breast surgery (2011); Tonsillectomy and adenoidectomy (1965); and Tubal ligation.   Her family history includes Cancer in her mother; Diabetes in her paternal grandfather and paternal grandmother; Heart disease in her father; Hypertension in her father, paternal grandfather, and paternal grandmother.She reports that she has never smoked. She does not have any smokeless tobacco history on file. She reports that she does not drink alcohol or use illicit drugs.  Outpatient Prescriptions Prior to Visit  Medication Sig Dispense Refill  . ergocalciferol (DRISDOL) 50000 UNITS capsule Take 1 capsule (50,000 Units total) by mouth once a week. (Patient not taking: Reported on 05/31/2016) 4 capsule 0   No facility-administered medications prior to visit.    Review  of Systems   Patient denies headache, fevers, malaise, unintentional weight loss, skin rash, eye pain, sinus congestion and sinus pain, sore throat, dysphagia,  hemoptysis , cough, dyspnea, wheezing, chest pain, palpitations, orthopnea, edema, abdominal  pain, nausea, melena, diarrhea, constipation, flank pain, dysuria, hematuria, urinary  Frequency, nocturia, numbness, tingling, seizures,  Focal weakness, Loss of consciousness,  Tremor, insomnia, depression, anxiety, and suicidal ideation.      Objective:  BP 120/84 mmHg  Pulse 81  Temp(Src) 97.8 F (36.6 C) (Oral)  Resp 12  Ht 5\' 6"  (1.676 m)  Wt 145 lb (65.772 kg)  BMI 23.41 kg/m2  SpO2 97%  Physical Exam   General Appearance:    Alert, cooperative, no distress, appears stated age  Head:    Normocephalic, without obvious abnormality, atraumatic  Eyes:    PERRL, conjunctiva/corneas clear, EOM's intact, fundi    benign, both eyes  Ears:    Normal TM's and external ear canals, both ears  Nose:   Nares normal, septum midline, mucosa normal, no drainage    or sinus tenderness  Throat:   Lips, mucosa, and tongue normal; teeth and gums normal  Neck:   Supple, symmetrical, trachea midline, no adenopathy;    thyroid:  no enlargement/tenderness/nodules; no carotid   bruit or JVD  Back:     Symmetric, no curvature, ROM normal, no CVA tenderness  Lungs:     Clear to auscultation bilaterally, respirations unlabored  Chest Wall:    No tenderness or deformity   Heart:    Regular rate and rhythm, S1 and S2 normal, no murmur, rub   or gallop  Breast Exam:    No tenderness, masses, or nipple abnormality  Abdomen:     Soft, non-tender, bowel sounds active all four quadrants,    no masses, no organomegaly  Genitalia:    Pelvic: cervix normal in appearance, external genitalia normal, no adnexal masses or tenderness, no cervical motion tenderness, rectovaginal septum normal, uterus normal size, shape, and consistency and vagina normal without discharge  Extremities:   Extremities normal, atraumatic, no cyanosis or edema  Pulses:   2+ and symmetric all extremities  Skin:   Skin color, texture, turgor normal, no rashes or lesions  Lymph nodes:   Cervical, supraclavicular, and axillary nodes normal   Neurologic:   CNII-XII intact, normal strength, sensation and reflexes    throughout       Assessment & Plan:   Problem List Items Addressed This Visit    Visit for preventive health examination    Annual comprehensive preventive exam was done as well as an evaluation and management of chronic conditions .  During the course of the visit the patient was educated and counseled about appropriate screening and preventive services including :  diabetes screening, lipid analysis with projected  10 year  risk for CAD , nutrition counseling, breast, cervical and colorectal cancer screening, and recommended immunizations.  Printed recommendations for health maintenance screenings was given      OSA (obstructive sleep apnea)   Vitamin D deficiency - Primary   Relevant Orders   VITAMIN D 25 Hydroxy (Vit-D Deficiency, Fractures) (Completed)    Other Visit Diagnoses    Cervical cancer screening        Relevant Orders    Cytology - PAP (Completed)    Hyperlipidemia        Relevant Orders    Lipid panel (Completed)    LDL cholesterol, direct (Completed)    Need for hepatitis C screening  test        Relevant Orders    Hepatitis C antibody (Completed)    Other fatigue        Relevant Orders    Comprehensive metabolic panel (Completed)    TSH (Completed)    CBC with Differential/Platelet (Completed)    Breast cancer screening        Relevant Orders    MM DIGITAL SCREENING BILATERAL       I have discontinued Latasha Rios's ergocalciferol. I am also having her start on ergocalciferol.  Meds ordered this encounter  Medications  . ergocalciferol (DRISDOL) 50000 units capsule    Sig: Take 1 capsule (50,000 Units total) by mouth once a week.    Dispense:  4 capsule    Refill:  0    Medications Discontinued During This Encounter  Medication Reason  . ergocalciferol (DRISDOL) 50000 UNITS capsule     Follow-up: Return in about 1 year (around 05/31/2017) for MAMMOGRAM OVOERDUE SEE  NOTE/ORDER.   Sherlene ShamsULLO, Envy L, MD

## 2016-05-31 NOTE — Progress Notes (Signed)
Pre-visit discussion using our clinic review tool. No additional management support is needed unless otherwise documented below in the visit note.  

## 2016-06-01 ENCOUNTER — Encounter: Payer: Self-pay | Admitting: Internal Medicine

## 2016-06-01 LAB — HEPATITIS C ANTIBODY: HCV AB: NEGATIVE

## 2016-06-01 LAB — CYTOLOGY - PAP

## 2016-06-01 MED ORDER — ERGOCALCIFEROL 1.25 MG (50000 UT) PO CAPS: 50000.0000 [IU] | ORAL_CAPSULE | ORAL | Status: DC

## 2016-06-03 DIAGNOSIS — G4733 Obstructive sleep apnea (adult) (pediatric): Secondary | ICD-10-CM | POA: Insufficient documentation

## 2016-06-03 NOTE — Assessment & Plan Note (Signed)
Annual comprehensive preventive exam was done as well as an evaluation and management of chronic conditions .  During the course of the visit the patient was educated and counseled about appropriate screening and preventive services including :  diabetes screening, lipid analysis with projected  10 year  risk for CAD , nutrition counseling, breast, cervical and colorectal cancer screening, and recommended immunizations.  Printed recommendations for health maintenance screenings was given 

## 2016-06-18 DIAGNOSIS — G4733 Obstructive sleep apnea (adult) (pediatric): Secondary | ICD-10-CM | POA: Diagnosis not present

## 2016-07-19 DIAGNOSIS — G4733 Obstructive sleep apnea (adult) (pediatric): Secondary | ICD-10-CM | POA: Diagnosis not present

## 2016-08-19 DIAGNOSIS — G4733 Obstructive sleep apnea (adult) (pediatric): Secondary | ICD-10-CM | POA: Diagnosis not present

## 2016-09-18 DIAGNOSIS — G4733 Obstructive sleep apnea (adult) (pediatric): Secondary | ICD-10-CM | POA: Diagnosis not present

## 2016-10-19 DIAGNOSIS — G4733 Obstructive sleep apnea (adult) (pediatric): Secondary | ICD-10-CM | POA: Diagnosis not present

## 2016-11-18 DIAGNOSIS — G4733 Obstructive sleep apnea (adult) (pediatric): Secondary | ICD-10-CM | POA: Diagnosis not present

## 2016-12-19 DIAGNOSIS — G4733 Obstructive sleep apnea (adult) (pediatric): Secondary | ICD-10-CM | POA: Diagnosis not present

## 2017-01-19 DIAGNOSIS — G4733 Obstructive sleep apnea (adult) (pediatric): Secondary | ICD-10-CM | POA: Diagnosis not present

## 2017-02-16 DIAGNOSIS — G4733 Obstructive sleep apnea (adult) (pediatric): Secondary | ICD-10-CM | POA: Diagnosis not present

## 2017-04-17 ENCOUNTER — Telehealth: Payer: Self-pay | Admitting: *Deleted

## 2017-04-17 MED ORDER — PREDNISONE 10 MG PO TABS: ORAL_TABLET | ORAL | 0 refills | Status: DC

## 2017-04-17 NOTE — Telephone Encounter (Signed)
Patient was advised of Dr. Melina Schoolsullo's advise , Pt did understand however pt stated she is using the combination already and its not working.  Pt contact [phone]

## 2017-04-17 NOTE — Telephone Encounter (Signed)
Patient requested advise on the best OTC medication to treat poison oak Pt contact  [phone]

## 2017-04-17 NOTE — Telephone Encounter (Signed)
NOT OTC.  WILL SEND STEROID TAPER TO [facility] TO ADD.

## 2017-04-17 NOTE — Telephone Encounter (Signed)
Left a VM to return my call, thanks 

## 2017-04-17 NOTE — Telephone Encounter (Signed)
Apply hydcortisone cream and calamine lotion  Twice  daily.  benadryl oral for itching

## 2017-04-17 NOTE — Telephone Encounter (Signed)
Please advise on your thoughts, thanks

## 2017-04-17 NOTE — Telephone Encounter (Signed)
Please advise any other options?

## 2017-04-17 NOTE — Telephone Encounter (Signed)
Spoke with the patient, she was very Adult nurseappreciative,.

## 2017-04-26 MED ORDER — PREDNISONE 10 MG PO TABS: ORAL_TABLET | ORAL | 0 refills | Status: DC

## 2017-04-26 NOTE — Telephone Encounter (Signed)
Pt called and stated that she need finished the predinose and stated that she was getting better but she is now breaking out again on her waist. Please advise, thank you!  Call pt @ [phone]

## 2017-04-26 NOTE — Telephone Encounter (Signed)
Not necessary,  I have refilled the prednisone taper for another week

## 2017-04-26 NOTE — Telephone Encounter (Signed)
Attempted to reach patient, left a detailed message to pick up the prednisone. thanks

## 2017-04-26 NOTE — Telephone Encounter (Signed)
Prednisone was making it better, but has reappeared on waist region (poison [place]) do you need to see her? Or should I send to urgent care?

## 2017-06-26 DIAGNOSIS — G4733 Obstructive sleep apnea (adult) (pediatric): Secondary | ICD-10-CM | POA: Diagnosis not present

## 2017-07-03 ENCOUNTER — Ambulatory Visit
Admission: RE | Admit: 2017-07-03 | Discharge: 2017-07-03 | Disposition: A | Discharge status: Home / Self Care | Payer: BLUE CROSS/BLUE SHIELD | Source: Ambulatory Visit | Attending: Internal Medicine | Admitting: Internal Medicine

## 2017-07-03 DIAGNOSIS — Z1231 Encounter for screening mammogram for malignant neoplasm of breast: Secondary | ICD-10-CM | POA: Diagnosis not present

## 2017-07-03 DIAGNOSIS — Z1239 Encounter for other screening for malignant neoplasm of breast: Secondary | ICD-10-CM

## 2017-07-05 ENCOUNTER — Telehealth: Payer: Self-pay | Admitting: *Deleted

## 2017-07-05 ENCOUNTER — Ambulatory Visit (INDEPENDENT_AMBULATORY_CARE_PROVIDER_SITE_OTHER): Payer: BLUE CROSS/BLUE SHIELD | Admitting: Internal Medicine

## 2017-07-05 ENCOUNTER — Encounter: Payer: Self-pay | Admitting: Internal Medicine

## 2017-07-05 VITALS — BP 116/80 | HR 85 | Temp 98.0°F | Resp 15 | Ht 65.5 in | Wt 144.2 lb

## 2017-07-05 DIAGNOSIS — E7800 Pure hypercholesterolemia, unspecified: Secondary | ICD-10-CM | POA: Diagnosis not present

## 2017-07-05 DIAGNOSIS — E785 Hyperlipidemia, unspecified: Secondary | ICD-10-CM | POA: Diagnosis not present

## 2017-07-05 DIAGNOSIS — L659 Nonscarring hair loss, unspecified: Secondary | ICD-10-CM | POA: Diagnosis not present

## 2017-07-05 DIAGNOSIS — E559 Vitamin D deficiency, unspecified: Secondary | ICD-10-CM | POA: Diagnosis not present

## 2017-07-05 DIAGNOSIS — B350 Tinea barbae and tinea capitis: Secondary | ICD-10-CM | POA: Diagnosis not present

## 2017-07-05 DIAGNOSIS — Z0000 Encounter for general adult medical examination without abnormal findings: Secondary | ICD-10-CM

## 2017-07-05 DIAGNOSIS — G4733 Obstructive sleep apnea (adult) (pediatric): Secondary | ICD-10-CM

## 2017-07-05 LAB — LIPID PANEL
CHOL/HDL RATIO: 5
Cholesterol: 225 mg/dL — ABNORMAL HIGH (ref 0–200)
HDL: 41.3 mg/dL (ref >39.00)
LDL Cholesterol: 169 mg/dL — ABNORMAL HIGH (ref 0–99)
NONHDL: 183.96
TRIGLYCERIDES: 75 mg/dL (ref 0.0–149.0)
VLDL: 15 mg/dL (ref 0.0–40.0)

## 2017-07-05 LAB — COMPREHENSIVE METABOLIC PANEL
ALK PHOS: 68 U/L (ref 39–117)
ALT: 15 U/L (ref 0–35)
AST: 16 U/L (ref 0–37)
Albumin: 4.5 g/dL (ref 3.5–5.2)
BILIRUBIN TOTAL: 0.6 mg/dL (ref 0.2–1.2)
BUN: 12 mg/dL (ref 6–23)
CALCIUM: 9.7 mg/dL (ref 8.4–10.5)
CO2: 31 mEq/L (ref 19–32)
Chloride: 102 mEq/L (ref 96–112)
Creatinine, Ser: 0.6 mg/dL (ref 0.40–1.20)
GFR: 109.27 mL/min (ref >60.00)
GLUCOSE: 89 mg/dL (ref 70–99)
POTASSIUM: 4.2 meq/L (ref 3.5–5.1)
Sodium: 140 mEq/L (ref 135–145)
TOTAL PROTEIN: 7.5 g/dL (ref 6.0–8.3)

## 2017-07-05 LAB — TSH: TSH: 0.45 u[IU]/mL (ref 0.35–4.50)

## 2017-07-05 LAB — VITAMIN D 25 HYDROXY (VIT D DEFICIENCY, FRACTURES): VITD: 19.88 ng/mL — ABNORMAL LOW (ref 30.00–100.00)

## 2017-07-05 MED ORDER — GRISEOFULVIN ULTRAMICROSIZE 125 MG PO TABS: 375.0000 mg | ORAL_TABLET | Freq: Every day | ORAL | 1 refills | Status: DC

## 2017-07-05 NOTE — Patient Instructions (Addendum)
I am going to treat your hair change as a case of tinea.  If it does not resolve after 4 to 6 weeks of the griseofulvin, ask DK to evaluate you    Your  mammogram was reported  as normal.  I recommend that we continue annual screening  You are due for colonoscopy in 2022  PAP smear due in 2020     Health Maintenance for Postmenopausal Women Menopause is a normal process in which your reproductive ability comes to an end. This process happens gradually over a span of months to years, usually between the ages of 42 and 64. Menopause is complete when you have missed 12 consecutive menstrual periods. It is important to talk with your health care provider about some of the most common conditions that affect postmenopausal women, such as heart disease, cancer, and bone loss (osteoporosis). Adopting a healthy lifestyle and getting preventive care can help to promote your health and wellness. Those actions can also lower your chances of developing some of these common conditions. What should I know about menopause? During menopause, you may experience a number of symptoms, such as:  Moderate-to-severe hot flashes.  Night sweats.  Decrease in sex drive.  Mood swings.  Headaches.  Tiredness.  Irritability.  Memory problems.  Insomnia.  Choosing to treat or not to treat menopausal changes is an individual decision that you make with your health care provider. What should I know about hormone replacement therapy and supplements? Hormone therapy products are effective for treating symptoms that are associated with menopause, such as hot flashes and night sweats. Hormone replacement carries certain risks, especially as you become older. If you are thinking about using estrogen or estrogen with progestin treatments, discuss the benefits and risks with your health care provider. What should I know about heart disease and stroke? Heart disease, heart attack, and stroke become more likely as you  age. This may be due, in part, to the hormonal changes that your body experiences during menopause. These can affect how your body processes dietary fats, triglycerides, and cholesterol. Heart attack and stroke are both medical emergencies. There are many things that you can do to help prevent heart disease and stroke:  Have your blood pressure checked at least every 1-2 years. High blood pressure causes heart disease and increases the risk of stroke.  If you are 80-67 years old, ask your health care provider if you should take aspirin to prevent a heart attack or a stroke.  Do not use any tobacco products, including cigarettes, chewing tobacco, or electronic cigarettes. If you need help quitting, ask your health care provider.  It is important to eat a healthy diet and maintain a healthy weight. ? Be sure to include plenty of vegetables, fruits, low-fat dairy products, and lean protein. ? Avoid eating foods that are high in solid fats, added sugars, or salt (sodium).  Get regular exercise. This is one of the most important things that you can do for your health. ? Try to exercise for at least 150 minutes each week. The type of exercise that you do should increase your heart rate and make you sweat. This is known as moderate-intensity exercise. ? Try to do strengthening exercises at least twice each week. Do these in addition to the moderate-intensity exercise.  Know your numbers.Ask your health care provider to check your cholesterol and your blood glucose. Continue to have your blood tested as directed by your health care provider.  What should I know about  cancer screening? There are several types of cancer. Take the following steps to reduce your risk and to catch any cancer development as early as possible. Breast Cancer  Practice breast self-awareness. ? This means understanding how your breasts normally appear and feel. ? It also means doing regular breast self-exams. Let your health  care provider know about any changes, no matter how small.  If you are 53 or older, have a clinician do a breast exam (clinical breast exam or CBE) every year. Depending on your age, family history, and medical history, it may be recommended that you also have a yearly breast X-ray (mammogram).  If you have a family history of breast cancer, talk with your health care provider about genetic screening.  If you are at high risk for breast cancer, talk with your health care provider about having an MRI and a mammogram every year.  Breast cancer (BRCA) gene test is recommended for women who have family members with BRCA-related cancers. Results of the assessment will determine the need for genetic counseling and BRCA1 and for BRCA2 testing. BRCA-related cancers include these types: ? Breast. This occurs in males or females. ? Ovarian. ? Tubal. This may also be called fallopian tube cancer. ? Cancer of the abdominal or pelvic lining (peritoneal cancer). ? Prostate. ? Pancreatic.  Cervical, Uterine, and Ovarian Cancer Your health care provider may recommend that you be screened regularly for cancer of the pelvic organs. These include your ovaries, uterus, and vagina. This screening involves a pelvic exam, which includes checking for microscopic changes to the surface of your cervix (Pap test).  For women ages 21-65, health care providers may recommend a pelvic exam and a Pap test every three years. For women ages 22-65, they may recommend the Pap test and pelvic exam, combined with testing for human papilloma virus (HPV), every five years. Some types of HPV increase your risk of cervical cancer. Testing for HPV may also be done on women of any age who have unclear Pap test results.  Other health care providers may not recommend any screening for nonpregnant women who are considered low risk for pelvic cancer and have no symptoms. Ask your health care provider if a screening pelvic exam is right for  you.  If you have had past treatment for cervical cancer or a condition that could lead to cancer, you need Pap tests and screening for cancer for at least 20 years after your treatment. If Pap tests have been discontinued for you, your risk factors (such as having a new sexual partner) need to be reassessed to determine if you should start having screenings again. Some women have medical problems that increase the chance of getting cervical cancer. In these cases, your health care provider may recommend that you have screening and Pap tests more often.  If you have a family history of uterine cancer or ovarian cancer, talk with your health care provider about genetic screening.  If you have vaginal bleeding after reaching menopause, tell your health care provider.  There are currently no reliable tests available to screen for ovarian cancer.  Lung Cancer Lung cancer screening is recommended for adults 14-31 years old who are at high risk for lung cancer because of a history of smoking. A yearly low-dose CT scan of the lungs is recommended if you:  Currently smoke.  Have a history of at least 30 pack-years of smoking and you currently smoke or have quit within the past 15 years. A pack-year is  smoking an average of one pack of cigarettes per day for one year.  Yearly screening should:  Continue until it has been 15 years since you quit.  Stop if you develop a health problem that would prevent you from having lung cancer treatment.  Colorectal Cancer  This type of cancer can be detected and can often be prevented.  Routine colorectal cancer screening usually begins at age 83 and continues through age 36.  If you have risk factors for colon cancer, your health care provider may recommend that you be screened at an earlier age.  If you have a family history of colorectal cancer, talk with your health care provider about genetic screening.  Your health care provider may also recommend  using home test kits to check for hidden blood in your stool.  A small camera at the end of a tube can be used to examine your colon directly (sigmoidoscopy or colonoscopy). This is done to check for the earliest forms of colorectal cancer.  Direct examination of the colon should be repeated every 5-10 years until age 72. However, if early forms of precancerous polyps or small growths are found or if you have a family history or genetic risk for colorectal cancer, you may need to be screened more often.  Skin Cancer  Check your skin from head to toe regularly.  Monitor any moles. Be sure to tell your health care provider: ? About any new moles or changes in moles, especially if there is a change in a mole's shape or color. ? If you have a mole that is larger than the size of a pencil eraser.  If any of your family members has a history of skin cancer, especially at a young age, talk with your health care provider about genetic screening.  Always use sunscreen. Apply sunscreen liberally and repeatedly throughout the day.  Whenever you are outside, protect yourself by wearing long sleeves, pants, a wide-brimmed hat, and sunglasses.  What should I know about osteoporosis? Osteoporosis is a condition in which bone destruction happens more quickly than new bone creation. After menopause, you may be at an increased risk for osteoporosis. To help prevent osteoporosis or the bone fractures that can happen because of osteoporosis, the following is recommended:  If you are 52-64 years old, get at least 1,000 mg of calcium and at least 600 mg of vitamin D per day.  If you are older than age 75 but younger than age 58, get at least 1,200 mg of calcium and at least 600 mg of vitamin D per day.  If you are older than age 86, get at least 1,200 mg of calcium and at least 800 mg of vitamin D per day.  Smoking and excessive alcohol intake increase the risk of osteoporosis. Eat foods that are rich in  calcium and vitamin D, and do weight-bearing exercises several times each week as directed by your health care provider. What should I know about how menopause affects my mental health? Depression may occur at any age, but it is more common as you become older. Common symptoms of depression include:  Low or sad mood.  Changes in sleep patterns.  Changes in appetite or eating patterns.  Feeling an overall lack of motivation or enjoyment of activities that you previously enjoyed.  Frequent crying spells.  Talk with your health care provider if you think that you are experiencing depression. What should I know about immunizations? It is important that you get and maintain  your immunizations. These include:  Tetanus, diphtheria, and pertussis (Tdap) booster vaccine.  Influenza every year before the flu season begins.  Pneumonia vaccine.  Shingles vaccine.  Your health care provider may also recommend other immunizations. This information is not intended to replace advice given to you by your health care provider. Make sure you discuss any questions you have with your health care provider. Document Released: 01/05/2006 Document Revised: 06/02/2016 Document Reviewed: 08/17/2015 Elsevier Interactive Patient Education  2018 [employer].

## 2017-07-05 NOTE — Telephone Encounter (Signed)
Does the pharmacy just not have the medication.  If no, can they get in or is there another pharmacy that has the medication.

## 2017-07-05 NOTE — Telephone Encounter (Signed)
Can you please advise since Dr. Darrick Huntsmanullo is gone. She was seen for this today.

## 2017-07-05 NOTE — Progress Notes (Signed)
Patient ID: Latasha Rios, female    DOB: [birth year]  Age: 56 y.o. MRN: [id number]  The patient is here for annual preventive  examination and management of other chronic and acute problems.  Colonoscopy due 2022 PAP smear normal 2017 mammogram normal August 2018  The risk factors are reflected in the social Latasha.  The roster of all physicians providing medical care to patient - is listed in the Snapshot section of the chart.  Activities of daily living:  The patient is 100% independent in all ADLs: dressing, toileting, feeding as well as independent mobility  Home safety : The patient has smoke detectors in the home. They wear seatbelts.  There are no firearms at home. There is no violence in the home.   There is no risks for hepatitis, STDs or HIV. There is no   Latasha of blood transfusion. They have no travel Latasha to infectious disease endemic areas of the world.  The patient has seen their dentist in the last six month. They have seen their eye doctor in the last year.    Discussed the need for sun protection: hats, long sleeves and use of sunscreen if there is significant sun exposure.   Diet: the importance of a healthy diet is discussed. They do have a healthy diet.  The benefits of regular aerobic exercise were discussed. She is not exercising regularly  But is maintaining a health BMI.   Depression screen: there are no signs or vegative symptoms of depression- irritability, change in appetite, anhedonia, sadness/tearfullness.   The following portions of the patient's Latasha were reviewed and updated as appropriate: allergies, current medications, past family Latasha, past medical Latasha,  past surgical Latasha, past social Latasha  and problem list.  Visual acuity was not assessed per patient preference since she has regular follow up with her ophthalmologist. Hearing and body mass index were assessed and reviewed.   During the course of the visit the patient was  educated and counseled about appropriate screening and preventive services including : fall prevention , diabetes screening, nutrition counseling, colorectal cancer screening, and recommended immunizations.    CC: The primary encounter diagnosis was Vitamin D deficiency. Diagnoses of Hair loss, Pure hypercholesterolemia, Visit for preventive health examination, OSA (obstructive sleep apnea), Hyperlipidemia LDL goal <130, and Tinea capitis were also pertinent to this visit.  Mother died last year. Father died  6 years ago.   Had contact dermatitis which has  now resolved.  Hairdresser has noticed several annular ares on scalp where she has  lost pigmentation nut not hair.   Brother with Down's syndrome living in the family home (where mother lived) 3 hours away ,  He is visiting this week .  60 with a walker,  Showing signsof Early dementia.  Has a caregiver. 4 nights per week.    Latasha Rios has no past medical Latasha on file.   She has a past surgical Latasha that includes Breast surgery (2011); Tonsillectomy and adenoidectomy (1965); Tubal ligation; and Reduction mammaplasty (Bilateral, 2014).   Her family Latasha includes Cancer in her mother; Diabetes in her paternal grandfather and paternal grandmother; Heart disease in her father; Hypertension in her father, paternal grandfather, and paternal grandmother.She reports that she has never smoked. She has never used smokeless tobacco. She reports that she does not drink alcohol or use drugs.  Outpatient Medications Prior to Visit  Medication Sig Dispense Refill  . ergocalciferol (DRISDOL) 50000 units capsule Take 1 capsule (50,000 Units total) by mouth  once a week. (Patient not taking: Reported on 07/05/2017) 4 capsule 0  . predniSONE (DELTASONE) 10 MG tablet 6 tablets on Day 1 , then reduce by 1 tablet daily until gone (Patient not taking: Reported on 07/05/2017) 21 tablet 0  . predniSONE (DELTASONE) 10 MG tablet 6 tablets on Day 1 , then  reduce by 1 tablet daily until gone (Patient not taking: Reported on 07/05/2017) 21 tablet 0   No facility-administered medications prior to visit.     Review of Systems   Patient denies headache, fevers, malaise, unintentional weight loss, skin rash, eye pain, sinus congestion and sinus pain, sore throat, dysphagia,  hemoptysis , cough, dyspnea, wheezing, chest pain, palpitations, orthopnea, edema, abdominal pain, nausea, melena, diarrhea, constipation, flank pain, dysuria, hematuria, urinary  Frequency, nocturia, numbness, tingling, seizures,  Focal weakness, Loss of consciousness,  Tremor, insomnia, depression, anxiety, and suicidal ideation.      Objective:  BP 116/80 (BP Location: Left Arm, Patient Position: Sitting, Cuff Size: Normal)   Pulse 85   Temp 98 F (36.7 C) (Oral)   Resp 15   Ht 5' 5.5" (1.664 m)   Wt 144 lb 3.2 oz (65.4 kg)   SpO2 96%   BMI 23.63 kg/m   Physical Exam   General appearance: alert, cooperative and appears stated age Head: Normocephalic, without obvious abnormality, atraumatic Eyes: conjunctivae/corneas clear. PERRL, EOM's intact. Fundi benign. Ears: normal TM's and external ear canals both ears Nose: Nares normal. Septum midline. Mucosa normal. No drainage or sinus tenderness. Throat: lips, mucosa, and tongue normal; teeth and gums normal Neck: no adenopathy, no carotid bruit, no JVD, supple, symmetrical, trachea midline and thyroid not enlarged, symmetric, no tenderness/mass/nodules Lungs: clear to auscultation bilaterally Breasts: normal appearance, no masses or tenderness Heart: regular rate and rhythm, S1, S2 normal, no murmur, click, rub or gallop Abdomen: soft, non-tender; bowel sounds normal; no masses,  no organomegaly Extremities: extremities normal, atraumatic, no cyanosis or edema Pulses: 2+ and symmetric Skin: Skin color, texture, turgor normal. No rashes or lesions Neurologic: Alert and oriented X 3, normal strength and tone. Normal  symmetric reflexes. Normal coordination and gait.   Scalp: several annular hypopigmented areas with thinning of hair.     Assessment & Plan:   Problem List Items Addressed This Visit    Vitamin D deficiency - Primary   Relevant Orders   VITAMIN D 25 Hydroxy (Vit-D Deficiency, Fractures) (Completed)   Visit for preventive health examination    Annual comprehensive preventive exam was done as well as an evaluation and management of chronic conditions .  During the course of the visit the patient was educated and counseled about appropriate screening and preventive services including :  diabetes screening, lipid analysis with projected  10 year  risk for CAD , nutrition counseling, breast, cervical and colorectal cancer screening, and recommended immunizations.  Printed.        Tinea capitis    Suspected, by examination of hypopigmented annular patches.   Trial of terbinafine 250 mg daily x  4 weeks,  Baseline liver enzymes are normal.  Lab Results  Component Value Date   ALT 15 07/05/2017   AST 16 07/05/2017   ALKPHOS 68 07/05/2017   BILITOT 0.6 07/05/2017         Relevant Medications   terbinafine (LAMISIL) 250 MG tablet   OSA (obstructive sleep apnea)    Moderately severe,  With desats to 76%  .  CPAP setting optimal at 5 cm H20 with resolution  of desats.  (ordered by Erline Hauhap mcQueen 2017)      Hyperlipidemia LDL goal <130    Using the Framingham risk calculator,  her 10 year risk of coronary artery disease is 6%. Repeat annually and recommend treatment when risk > 8%   Lab Results  Component Value Date   CHOL 225 (H) 07/05/2017   HDL 41.30 07/05/2017   LDLCALC 169 (H) 07/05/2017   LDLDIRECT 193.0 05/31/2016   TRIG 75.0 07/05/2017   CHOLHDL 5 07/05/2017         Other Visit Diagnoses    Hair loss       Relevant Orders   Comprehensive metabolic panel (Completed)   TSH (Completed)   Pure hypercholesterolemia       Relevant Orders   Lipid panel (Completed)       I have discontinued Ms. Courtright's ergocalciferol, predniSONE, predniSONE, and griseofulvin. I am also having her start on terbinafine.  Meds ordered this encounter  Medications  . DISCONTD: griseofulvin (GRIS-PEG) 125 MG tablet    Sig: Take 3 tablets (375 mg total) by mouth daily.    Dispense:  90 tablet    Refill:  1  . terbinafine (LAMISIL) 250 MG tablet    Sig: Take 1 tablet (250 mg total) by mouth daily.    Dispense:  30 tablet    Refill:  0    Medications Discontinued During This Encounter  Medication Reason  . ergocalciferol (DRISDOL) 50000 units capsule   . predniSONE (DELTASONE) 10 MG tablet   . predniSONE (DELTASONE) 10 MG tablet   . griseofulvin (GRIS-PEG) 125 MG tablet     Follow-up: No Follow-up on file.   Sherlene ShamsULLO, Deeksha L, MD

## 2017-07-05 NOTE — Telephone Encounter (Signed)
[facility] stated that griseofulvin is a medication that's not available . They requested to have a alternate medication if possible

## 2017-07-05 NOTE — Telephone Encounter (Signed)
Spoke with pt and and she stated that [facility] and they stated that they could not find a pharmacy in [place] that carried this medication. Pt is wanting to know if there is anything else that can be used or if you would just like her to wait until she sees Dr. Mathis FareKalwalski. Pt stated that this is not urgent and could wait until Dr. Darrick Huntsmanullo got back in town.

## 2017-07-08 ENCOUNTER — Other Ambulatory Visit: Payer: Self-pay | Admitting: Internal Medicine

## 2017-07-08 ENCOUNTER — Encounter: Payer: Self-pay | Admitting: Internal Medicine

## 2017-07-08 DIAGNOSIS — B350 Tinea barbae and tinea capitis: Secondary | ICD-10-CM

## 2017-07-08 DIAGNOSIS — E782 Mixed hyperlipidemia: Secondary | ICD-10-CM | POA: Insufficient documentation

## 2017-07-08 DIAGNOSIS — E785 Hyperlipidemia, unspecified: Secondary | ICD-10-CM | POA: Insufficient documentation

## 2017-07-08 DIAGNOSIS — E559 Vitamin D deficiency, unspecified: Secondary | ICD-10-CM

## 2017-07-08 HISTORY — DX: Tinea barbae and tinea capitis: B35.0

## 2017-07-08 MED ORDER — TERBINAFINE HCL 250 MG PO TABS: 250.0000 mg | ORAL_TABLET | Freq: Every day | ORAL | 0 refills | Status: DC

## 2017-07-08 MED ORDER — ERGOCALCIFEROL 1.25 MG (50000 UT) PO CAPS: 50000.0000 [IU] | ORAL_CAPSULE | ORAL | 2 refills | Status: DC

## 2017-07-08 NOTE — Assessment & Plan Note (Signed)
Using the Framingham risk calculator,  her 10 year risk of coronary artery disease is 6%. Repeat annually and recommend treatment when risk > 8%   Lab Results  Component Value Date   CHOL 225 (H) 07/05/2017   HDL 41.30 07/05/2017   LDLCALC 169 (H) 07/05/2017   LDLDIRECT 193.0 05/31/2016   TRIG 75.0 07/05/2017   CHOLHDL 5 07/05/2017

## 2017-07-08 NOTE — Assessment & Plan Note (Signed)
Moderately severe,  With desats to 76%  .  CPAP setting optimal at 5 cm H20 with resolution of desats.  (ordered by Erline Hauhap mcQueen 2017)

## 2017-07-08 NOTE — Telephone Encounter (Signed)
Alternative med sent.  Patient updated

## 2017-07-08 NOTE — Assessment & Plan Note (Signed)
Annual comprehensive preventive exam was done as well as an evaluation and management of chronic conditions .  During the course of the visit the patient was educated and counseled about appropriate screening and preventive services including :  diabetes screening, lipid analysis with projected  10 year  risk for CAD , nutrition counseling, breast, cervical and colorectal cancer screening, and recommended immunizations.  Printed.

## 2017-07-08 NOTE — Assessment & Plan Note (Signed)
Repeat megadose for 3 months

## 2017-07-08 NOTE — Assessment & Plan Note (Addendum)
Suspected, by examination of hypopigmented annular patches.   Trial of terbinafine 250 mg daily x  4 weeks,  Baseline liver enzymes are normal.  Lab Results  Component Value Date   ALT 15 07/05/2017   AST 16 07/05/2017   ALKPHOS 68 07/05/2017   BILITOT 0.6 07/05/2017

## 2017-12-06 DIAGNOSIS — G4733 Obstructive sleep apnea (adult) (pediatric): Secondary | ICD-10-CM | POA: Diagnosis not present

## 2018-03-29 ENCOUNTER — Ambulatory Visit: Payer: Self-pay

## 2018-03-29 ENCOUNTER — Telehealth: Payer: Self-pay | Admitting: Internal Medicine

## 2018-03-29 NOTE — Telephone Encounter (Signed)
See triage encounter.

## 2018-03-29 NOTE — Telephone Encounter (Addendum)
Patient called in with c/o "poison ivy rash." She says "it happened last week, just a spot behind my calf, now it has spread to the other calf. The left calf has a few small spots. The right calf is the worse, with pus filled large area of rash, about 3 in x 3 in. I have tried all of the OTC medications, but it's not working and the itching is pretty bad." I advised that she will need to be seen, she says "I am out of town. I called at noon today and you are just getting back to me. I will be back on Monday." Patient initial call was for a request of prednisone. I called the office and spoke to Patina, [facility] who agrees that she will need to be seen by a provider and advised the patient to go to an UC. I spoke to the patient and advised to go to an Urgent Care to be seen, she says "ok, thank you." She hung up the phone before care advice could be given.   Reason for Disposition . SEVERE itching (e.g., interferes with sleep or normal activities)  Answer Assessment - Initial Assessment Questions 1. APPEARANCE of RASH: "Describe the rash."     Pus filled rash 2. LOCATION: "Where is the rash located?"      Few spots on the back of one calf; right calf one big area 3. SIZE: "How large is the rash?"      3 in x 3 in 4. ONSET: "When did the rash begin?"      1 week ago 5. ITCHING: "Does the rash itch?" If so, ask: "How bad is it?"   - MILD - doesn't interfere with normal activities   - MODERATE-SEVERE: interferes with work, school, sleep, or other activities      Moderate-severe 6. PREGNANCY: "Is there any chance you are pregnant?" "When was your last menstrual period?"     No  Protocols used: POISON IVY - OAK - SUMAC-A-AH

## 2018-03-29 NOTE — Telephone Encounter (Signed)
Copied from CRM [id number]. Topic: Quick Communication - Rx Refill/Question >> Mar 29, 2018 12:03 PM Alexander Bergeron B wrote: Medication: prednisone Pt called b/c she has poison oak over some areas of her body and is wanting a Rx called in for her, call pt to advise

## 2018-03-29 NOTE — Telephone Encounter (Signed)
Patient called, left detailed VM to return the call to the office to speak to NT about the poison ivy. Advised she will need to be seen by a provider before prescribing Prednisone.

## 2018-05-09 DIAGNOSIS — M5412 Radiculopathy, cervical region: Secondary | ICD-10-CM | POA: Diagnosis not present

## 2018-05-14 DIAGNOSIS — G4733 Obstructive sleep apnea (adult) (pediatric): Secondary | ICD-10-CM | POA: Diagnosis not present

## 2018-06-22 DIAGNOSIS — J029 Acute pharyngitis, unspecified: Secondary | ICD-10-CM | POA: Diagnosis not present

## 2018-06-22 DIAGNOSIS — J0100 Acute maxillary sinusitis, unspecified: Secondary | ICD-10-CM | POA: Diagnosis not present

## 2018-06-22 DIAGNOSIS — R0982 Postnasal drip: Secondary | ICD-10-CM | POA: Diagnosis not present

## 2018-06-22 DIAGNOSIS — Z6823 Body mass index (BMI) 23.0-23.9, adult: Secondary | ICD-10-CM | POA: Diagnosis not present

## 2018-07-03 ENCOUNTER — Encounter: Payer: Self-pay | Admitting: Internal Medicine

## 2018-07-03 ENCOUNTER — Ambulatory Visit (INDEPENDENT_AMBULATORY_CARE_PROVIDER_SITE_OTHER): Payer: BLUE CROSS/BLUE SHIELD | Admitting: Internal Medicine

## 2018-07-03 VITALS — BP 128/76 | HR 84 | Temp 98.1°F | Resp 14 | Ht 65.5 in | Wt 147.8 lb

## 2018-07-03 DIAGNOSIS — R5383 Other fatigue: Secondary | ICD-10-CM | POA: Diagnosis not present

## 2018-07-03 DIAGNOSIS — E785 Hyperlipidemia, unspecified: Secondary | ICD-10-CM

## 2018-07-03 DIAGNOSIS — Z0000 Encounter for general adult medical examination without abnormal findings: Secondary | ICD-10-CM

## 2018-07-03 DIAGNOSIS — Z1231 Encounter for screening mammogram for malignant neoplasm of breast: Secondary | ICD-10-CM

## 2018-07-03 DIAGNOSIS — E559 Vitamin D deficiency, unspecified: Secondary | ICD-10-CM

## 2018-07-03 DIAGNOSIS — G4733 Obstructive sleep apnea (adult) (pediatric): Secondary | ICD-10-CM

## 2018-07-03 DIAGNOSIS — Z1239 Encounter for other screening for malignant neoplasm of breast: Secondary | ICD-10-CM

## 2018-07-03 LAB — CBC WITH DIFFERENTIAL/PLATELET
Basophils Absolute: 0 10*3/uL (ref 0.0–0.1)
Basophils Relative: 0.5 % (ref 0.0–3.0)
EOS ABS: 0.2 10*3/uL (ref 0.0–0.7)
Eosinophils Relative: 3 % (ref 0.0–5.0)
HCT: 41.3 % (ref 36.0–46.0)
HEMOGLOBIN: 14.2 g/dL (ref 12.0–15.0)
LYMPHS ABS: 1.9 10*3/uL (ref 0.7–4.0)
Lymphocytes Relative: 25.9 % (ref 12.0–46.0)
MCHC: 34.3 g/dL (ref 30.0–36.0)
MCV: 94.7 fl (ref 78.0–100.0)
MONO ABS: 0.7 10*3/uL (ref 0.1–1.0)
Monocytes Relative: 9.5 % (ref 3.0–12.0)
NEUTROS PCT: 61.1 % (ref 43.0–77.0)
Neutro Abs: 4.5 10*3/uL (ref 1.4–7.7)
Platelets: 343 10*3/uL (ref 150.0–400.0)
RBC: 4.36 Mil/uL (ref 3.87–5.11)
RDW: 13.2 % (ref 11.5–15.5)
WBC: 7.3 10*3/uL (ref 4.0–10.5)

## 2018-07-03 LAB — LIPID PANEL
Cholesterol: 228 mg/dL — ABNORMAL HIGH (ref 0–200)
HDL: 44.9 mg/dL (ref >39.00)
LDL Cholesterol: 161 mg/dL — ABNORMAL HIGH (ref 0–99)
NONHDL: 182.87
Total CHOL/HDL Ratio: 5
Triglycerides: 109 mg/dL (ref 0.0–149.0)
VLDL: 21.8 mg/dL (ref 0.0–40.0)

## 2018-07-03 LAB — COMPREHENSIVE METABOLIC PANEL
ALK PHOS: 62 U/L (ref 39–117)
ALT: 22 U/L (ref 0–35)
AST: 18 U/L (ref 0–37)
Albumin: 4.4 g/dL (ref 3.5–5.2)
BUN: 16 mg/dL (ref 6–23)
CO2: 32 mEq/L (ref 19–32)
Calcium: 9.6 mg/dL (ref 8.4–10.5)
Chloride: 103 mEq/L (ref 96–112)
Creatinine, Ser: 0.62 mg/dL (ref 0.40–1.20)
GFR: 104.85 mL/min (ref >60.00)
GLUCOSE: 94 mg/dL (ref 70–99)
POTASSIUM: 4.4 meq/L (ref 3.5–5.1)
SODIUM: 140 meq/L (ref 135–145)
TOTAL PROTEIN: 7.3 g/dL (ref 6.0–8.3)
Total Bilirubin: 0.8 mg/dL (ref 0.2–1.2)

## 2018-07-03 LAB — VITAMIN D 25 HYDROXY (VIT D DEFICIENCY, FRACTURES): VITD: 22.41 ng/mL — ABNORMAL LOW (ref 30.00–100.00)

## 2018-07-03 LAB — TSH: TSH: 0.44 u[IU]/mL (ref 0.35–4.50)

## 2018-07-03 MED ORDER — PREDNISONE 10 MG PO TABS: ORAL_TABLET | ORAL | 0 refills | Status: DC

## 2018-07-03 MED ORDER — TRIAMCINOLONE ACETONIDE 0.1 % EX CREA: 1.0000 "application " | TOPICAL_CREAM | Freq: Two times a day (BID) | CUTANEOUS | 2 refills | Status: DC

## 2018-07-03 NOTE — Progress Notes (Signed)
Patient ID: Latasha Rios, female    DOB: 1961-03-31  Age: 57 y.o. MRN: [id number]  The patient is here for annual preventive  examination and management of other chronic and acute problems.  PAP due 2020  Mammogram due   The risk factors are reflected in the social history.  The roster of all physicians providing medical care to patient - is listed in the Snapshot section of the chart.  Activities of daily living:  The patient is 100% independent in all ADLs: dressing, toileting, feeding as well as independent mobility  Home safety : The patient has smoke detectors in the home. They wear seatbelts.  There are secured firearms at home. There is no violence in the home.   There is no risks for hepatitis, STDs or HIV. There is no   history of blood transfusion. They have no travel history to infectious disease endemic areas of the world.  The patient has seen their dentist in the last six month. They have seen their eye doctor in the last year. They deny hearing difficulty with regard to whispered voices and some television programs.   They do not  have excessive sun exposure. Discussed the need for sun protection: hats, long sleeves and use of sunscreen if there is significant sun exposure.   Diet: the importance of a healthy diet is discussed. They do have a healthy diet.  The benefits of regular aerobic exercise were discussed. She exercises 3 times per week ,  60 minutes.   Depression screen: there are no signs or vegative symptoms of depression- irritability, change in appetite, anhedonia, sadness/tearfullness.  Cognitive assessment: the patient manages all their financial and personal affairs and is actively engaged. They could relate day,date,year and events; recalled 2/3 objects at 3 minutes; performed clock-face test normally.  The following portions of the patient's history were reviewed and updated as appropriate: allergies, current medications, past family history, past  medical history,  past surgical history, past social history  and problem list.  Visual acuity was not assessed per patient preference since she has regular follow up with her ophthalmologist. Hearing and body mass index were assessed and reviewed.   During the course of the visit the patient was educated and counseled about appropriate screening and preventive services including : fall prevention , diabetes screening, nutrition counseling, colorectal cancer screening, and recommended immunizations.    CC: The primary encounter diagnosis was Hyperlipidemia LDL goal <130. Diagnoses of Vitamin D deficiency, Fatigue, unspecified type, Breast cancer screening, Visit for preventive health examination, and OSA (obstructive sleep apnea) were also pertinent to this visit.  Vit d deficiency  Hyperlipidemia mild OSA , severe, with desats to 76%   [facility])  Normal BMI   Saw [facility] for right shoulder pain and numbness ,,  atrtributed to cervical disk disease   History Anissa has a past medical history of Tinea capitis (07/08/2017).   She has a past surgical history that includes Breast surgery (2011); Tonsillectomy and adenoidectomy (1965); Tubal ligation; and Reduction mammaplasty (Bilateral, 2014).   Her family history includes Cancer in her mother; Diabetes in her paternal grandfather and paternal grandmother; Down syndrome in her brother; Heart disease (age of onset: 65) in her father; Hypertension in her brother, father, paternal grandfather, paternal grandmother, and sister; Leukemia in her brother.She reports that she has never smoked. She has never used smokeless tobacco. She reports that she does not drink alcohol or use drugs.  Outpatient Medications Prior to Visit  Medication Sig  Dispense Refill  . ergocalciferol (DRISDOL) 50000 units capsule Take 1 capsule (50,000 Units total) by mouth once a week. (Patient not taking: Reported on 07/03/2018) 4 capsule 2  . terbinafine (LAMISIL) 250 MG  tablet Take 1 tablet (250 mg total) by mouth daily. (Patient not taking: Reported on 07/03/2018) 30 tablet 0   No facility-administered medications prior to visit.     Review of Systems   Patient denies headache, fevers, malaise, unintentional weight loss, skin rash, eye pain, sinus congestion and sinus pain, sore throat, dysphagia,  hemoptysis , cough, dyspnea, wheezing, chest pain, palpitations, orthopnea, edema, abdominal pain, nausea, melena, diarrhea, constipation, flank pain, dysuria, hematuria, urinary  Frequency, nocturia, numbness, tingling, seizures,  Focal weakness, Loss of consciousness,  Tremor, insomnia, depression, anxiety, and suicidal ideation.     Objective:  BP 128/76 (BP Location: Left Arm, Patient Position: Sitting, Cuff Size: Normal)   Pulse 84   Temp 98.1 F (36.7 C) (Oral)   Resp 14   Ht 5' 5.5" (1.664 m)   Wt 147 lb 12.8 oz (67 kg)   SpO2 97%   BMI 24.22 kg/m   Physical Exam  General appearance: alert, cooperative and appears stated age Head: Normocephalic, without obvious abnormality, atraumatic Eyes: conjunctivae/corneas clear. PERRL, EOM's intact. Fundi benign. Ears: normal TM's and external ear canals both ears Nose: Nares normal. Septum midline. Mucosa normal. No drainage or sinus tenderness. Throat: lips, mucosa, and tongue normal; teeth and gums normal Neck: no adenopathy, no carotid bruit, no JVD, supple, symmetrical, trachea midline and thyroid not enlarged, symmetric, no tenderness/mass/nodules Lungs: clear to auscultation bilaterally Breasts: normal appearance, no masses or tenderness Heart: regular rate and rhythm, S1, S2 normal, no murmur, click, rub or gallop Abdomen: soft, non-tender; bowel sounds normal; no masses,  no organomegaly Extremities: extremities normal, atraumatic, no cyanosis or edema Pulses: 2+ and symmetric Skin: Skin color, texture, turgor normal. No rashes or lesions Neurologic: Alert and oriented X 3, normal strength and  tone. Normal symmetric reflexes. Normal coordination and gait.     Assessment & Plan:   Problem List Items Addressed This Visit    Visit for preventive health examination    age appropriate education and counseling updated, referrals for preventative services and immunizations addressed, dietary and smoking counseling addressed, most recent labs reviewed.  I have personally reviewed and have noted:  1) the patient's medical and social history 2) The pt's use of alcohol, tobacco, and illicit drugs 3) The patient's current medications and supplements 4) Functional ability including ADL's, fall risk, home safety risk, hearing and visual impairment 5) Diet and physical activities 6) Evidence for depression or mood disorder 7) The patient's height, weight, and BMI have been recorded in the chart  I have made referrals, and provided counseling and education based on review of the above      Vitamin D deficiency    Recurrently low.  Resume megadose       Relevant Orders   VITAMIN D 25 Hydroxy (Vit-D Deficiency, Fractures) (Completed)   OSA (obstructive sleep apnea)    Moderately severe,  With desats to 76%  .  CPAP setting optimal at 5 cm H20 with resolution of desats.  (ordered by Erline Hauhap mcQueen 2017). She is wearing her CPAP every night a minimum of 6 hours per night and notes improved daytime wakefulness and decreased fatigue       Hyperlipidemia LDL goal <130 - Primary    Using the Framingham risk calculator,  her 10 year risk  of coronary artery disease is 8%.  No therapy warranted based on recent guideline changes    Lab Results  Component Value Date   CHOL 228 (H) 07/03/2018   HDL 44.90 07/03/2018   LDLCALC 161 (H) 07/03/2018   LDLDIRECT 193.0 05/31/2016   TRIG 109.0 07/03/2018   CHOLHDL 5 07/03/2018         Relevant Orders   Lipid panel (Completed)    Other Visit Diagnoses    Fatigue, unspecified type       Relevant Orders   TSH (Completed)   CBC with  Differential/Platelet (Completed)   Comprehensive metabolic panel (Completed)   Breast cancer screening       Relevant Orders   MM 3D SCREEN BREAST BILATERAL      I have discontinued Aurther Loft. Rounds's terbinafine and ergocalciferol. I am also having her start on predniSONE and triamcinolone cream.  Meds ordered this encounter  Medications  . predniSONE (DELTASONE) 10 MG tablet    Sig: 6 tablets on Day 1 , then reduce by 1 tablet daily until gone    Dispense:  21 tablet    Refill:  0  . triamcinolone cream (KENALOG) 0.1 %    Sig: Apply 1 application topically 2 (two) times daily.    Dispense:  30 g    Refill:  2    Medications Discontinued During This Encounter  Medication Reason  . ergocalciferol (DRISDOL) 50000 units capsule Completed Course  . terbinafine (LAMISIL) 250 MG tablet Completed Course    Follow-up: Return in about 1 year (around 07/04/2019).   Sherlene Shams, MD

## 2018-07-03 NOTE — Patient Instructions (Signed)
Your mammogram has been ordered.  You can call to make your appointment at [facility]   Health Maintenance for Postmenopausal Women Menopause is a normal process in which your reproductive ability comes to an end. This process happens gradually over a span of months to years, usually between the ages of 75 and 20. Menopause is complete when you have missed 12 consecutive menstrual periods. It is important to talk with your health care provider about some of the most common conditions that affect postmenopausal women, such as heart disease, cancer, and bone loss (osteoporosis). Adopting a healthy lifestyle and getting preventive care can help to promote your health and wellness. Those actions can also lower your chances of developing some of these common conditions. What should I know about menopause? During menopause, you may experience a number of symptoms, such as:  Moderate-to-severe hot flashes.  Night sweats.  Decrease in sex drive.  Mood swings.  Headaches.  Tiredness.  Irritability.  Memory problems.  Insomnia.  Choosing to treat or not to treat menopausal changes is an individual decision that you make with your health care provider. What should I know about hormone replacement therapy and supplements? Hormone therapy products are effective for treating symptoms that are associated with menopause, such as hot flashes and night sweats. Hormone replacement carries certain risks, especially as you become older. If you are thinking about using estrogen or estrogen with progestin treatments, discuss the benefits and risks with your health care provider. What should I know about heart disease and stroke? Heart disease, heart attack, and stroke become more likely as you age. This may be due, in part, to the hormonal changes that your body experiences during menopause. These can affect how your body processes dietary fats, triglycerides, and cholesterol. Heart attack and  stroke are both medical emergencies. There are many things that you can do to help prevent heart disease and stroke:  Have your blood pressure checked at least every 1-2 years. High blood pressure causes heart disease and increases the risk of stroke.  If you are 25-8 years old, ask your health care provider if you should take aspirin to prevent a heart attack or a stroke.  Do not use any tobacco products, including cigarettes, chewing tobacco, or electronic cigarettes. If you need help quitting, ask your health care provider.  It is important to eat a healthy diet and maintain a healthy weight. ? Be sure to include plenty of vegetables, fruits, low-fat dairy products, and lean protein. ? Avoid eating foods that are high in solid fats, added sugars, or salt (sodium).  Get regular exercise. This is one of the most important things that you can do for your health. ? Try to exercise for at least 150 minutes each week. The type of exercise that you do should increase your heart rate and make you sweat. This is known as moderate-intensity exercise. ? Try to do strengthening exercises at least twice each week. Do these in addition to the moderate-intensity exercise.  Know your numbers.Ask your health care provider to check your cholesterol and your blood glucose. Continue to have your blood tested as directed by your health care provider.  What should I know about cancer screening? There are several types of cancer. Take the following steps to reduce your risk and to catch any cancer development as early as possible. Breast Cancer  Practice breast self-awareness. ? This means understanding how your breasts normally appear and feel. ? It also means doing regular  breast self-exams. Let your health care provider know about any changes, no matter how small.  If you are 33 or older, have a clinician do a breast exam (clinical breast exam or CBE) every year. Depending on your age, family history,  and medical history, it may be recommended that you also have a yearly breast X-ray (mammogram).  If you have a family history of breast cancer, talk with your health care provider about genetic screening.  If you are at high risk for breast cancer, talk with your health care provider about having an MRI and a mammogram every year.  Breast cancer (BRCA) gene test is recommended for women who have family members with BRCA-related cancers. Results of the assessment will determine the need for genetic counseling and BRCA1 and for BRCA2 testing. BRCA-related cancers include these types: ? Breast. This occurs in males or females. ? Ovarian. ? Tubal. This may also be called fallopian tube cancer. ? Cancer of the abdominal or pelvic lining (peritoneal cancer). ? Prostate. ? Pancreatic.  Cervical, Uterine, and Ovarian Cancer Your health care provider may recommend that you be screened regularly for cancer of the pelvic organs. These include your ovaries, uterus, and vagina. This screening involves a pelvic exam, which includes checking for microscopic changes to the surface of your cervix (Pap test).  For women ages 21-65, health care providers may recommend a pelvic exam and a Pap test every three years. For women ages 41-65, they may recommend the Pap test and pelvic exam, combined with testing for human papilloma virus (HPV), every five years. Some types of HPV increase your risk of cervical cancer. Testing for HPV may also be done on women of any age who have unclear Pap test results.  Other health care providers may not recommend any screening for nonpregnant women who are considered low risk for pelvic cancer and have no symptoms. Ask your health care provider if a screening pelvic exam is right for you.  If you have had past treatment for cervical cancer or a condition that could lead to cancer, you need Pap tests and screening for cancer for at least 20 years after your treatment. If Pap tests  have been discontinued for you, your risk factors (such as having a new sexual partner) need to be reassessed to determine if you should start having screenings again. Some women have medical problems that increase the chance of getting cervical cancer. In these cases, your health care provider may recommend that you have screening and Pap tests more often.  If you have a family history of uterine cancer or ovarian cancer, talk with your health care provider about genetic screening.  If you have vaginal bleeding after reaching menopause, tell your health care provider.  There are currently no reliable tests available to screen for ovarian cancer.  Lung Cancer Lung cancer screening is recommended for adults 63-25 years old who are at high risk for lung cancer because of a history of smoking. A yearly low-dose CT scan of the lungs is recommended if you:  Currently smoke.  Have a history of at least 30 pack-years of smoking and you currently smoke or have quit within the past 15 years. A pack-year is smoking an average of one pack of cigarettes per day for one year.  Yearly screening should:  Continue until it has been 15 years since you quit.  Stop if you develop a health problem that would prevent you from having lung cancer treatment.  Colorectal Cancer  This type of cancer can be detected and can often be prevented.  Routine colorectal cancer screening usually begins at age 52 and continues through age 34.  If you have risk factors for colon cancer, your health care provider may recommend that you be screened at an earlier age.  If you have a family history of colorectal cancer, talk with your health care provider about genetic screening.  Your health care provider may also recommend using home test kits to check for hidden blood in your stool.  A small camera at the end of a tube can be used to examine your colon directly (sigmoidoscopy or colonoscopy). This is done to check for  the earliest forms of colorectal cancer.  Direct examination of the colon should be repeated every 5-10 years until age 56. However, if early forms of precancerous polyps or small growths are found or if you have a family history or genetic risk for colorectal cancer, you may need to be screened more often.  Skin Cancer  Check your skin from head to toe regularly.  Monitor any moles. Be sure to tell your health care provider: ? About any new moles or changes in moles, especially if there is a change in a mole's shape or color. ? If you have a mole that is larger than the size of a pencil eraser.  If any of your family members has a history of skin cancer, especially at a young age, talk with your health care provider about genetic screening.  Always use sunscreen. Apply sunscreen liberally and repeatedly throughout the day.  Whenever you are outside, protect yourself by wearing long sleeves, pants, a wide-brimmed hat, and sunglasses.  What should I know about osteoporosis? Osteoporosis is a condition in which bone destruction happens more quickly than new bone creation. After menopause, you may be at an increased risk for osteoporosis. To help prevent osteoporosis or the bone fractures that can happen because of osteoporosis, the following is recommended:  If you are 81-17 years old, get at least 1,000 mg of calcium and at least 600 mg of vitamin D per day.  If you are older than age 48 but younger than age 8, get at least 1,200 mg of calcium and at least 600 mg of vitamin D per day.  If you are older than age 75, get at least 1,200 mg of calcium and at least 800 mg of vitamin D per day.  Smoking and excessive alcohol intake increase the risk of osteoporosis. Eat foods that are rich in calcium and vitamin D, and do weight-bearing exercises several times each week as directed by your health care provider. What should I know about how menopause affects my mental health? Depression may  occur at any age, but it is more common as you become older. Common symptoms of depression include:  Low or sad mood.  Changes in sleep patterns.  Changes in appetite or eating patterns.  Feeling an overall lack of motivation or enjoyment of activities that you previously enjoyed.  Frequent crying spells.  Talk with your health care provider if you think that you are experiencing depression. What should I know about immunizations? It is important that you get and maintain your immunizations. These include:  Tetanus, diphtheria, and pertussis (Tdap) booster vaccine.  Influenza every year before the flu season begins.  Pneumonia vaccine.  Shingles vaccine.  Your health care provider may also recommend other immunizations. This information is not intended to replace advice given to you by  your health care provider. Make sure you discuss any questions you have with your health care provider. Document Released: 01/05/2006 Document Revised: 06/02/2016 Document Reviewed: 08/17/2015 Elsevier Interactive Patient Education  2018 [employer]  

## 2018-07-04 ENCOUNTER — Encounter: Payer: Self-pay | Admitting: Internal Medicine

## 2018-07-04 MED ORDER — ERGOCALCIFEROL 1.25 MG (50000 UT) PO CAPS: 50000.0000 [IU] | ORAL_CAPSULE | ORAL | 0 refills | Status: DC

## 2018-07-04 NOTE — Assessment & Plan Note (Signed)
Recurrently low.  Resume megadose

## 2018-07-04 NOTE — Assessment & Plan Note (Signed)
Using the Framingham risk calculator,  her 10 year risk of coronary artery disease is 8%.  No therapy warranted based on recent guideline changes    Lab Results  Component Value Date   CHOL 228 (H) 07/03/2018   HDL 44.90 07/03/2018   LDLCALC 161 (H) 07/03/2018   LDLDIRECT 193.0 05/31/2016   TRIG 109.0 07/03/2018   CHOLHDL 5 07/03/2018

## 2018-07-04 NOTE — Assessment & Plan Note (Signed)

## 2018-07-04 NOTE — Assessment & Plan Note (Signed)
Moderately severe,  With desats to 76%  .  CPAP setting optimal at 5 cm H20 with resolution of desats.  (ordered by Erline Hauhap mcQueen 2017). She is wearing her CPAP every night a minimum of 6 hours per night and notes improved daytime wakefulness and decreased fatigue

## 2018-08-23 ENCOUNTER — Encounter: Payer: Self-pay | Admitting: Internal Medicine

## 2018-09-18 ENCOUNTER — Ambulatory Visit
Admission: RE | Admit: 2018-09-18 | Discharge: 2018-09-18 | Disposition: A | Discharge status: Home / Self Care | Payer: BLUE CROSS/BLUE SHIELD | Source: Ambulatory Visit | Attending: Internal Medicine | Admitting: Internal Medicine

## 2018-09-18 DIAGNOSIS — Z1231 Encounter for screening mammogram for malignant neoplasm of breast: Secondary | ICD-10-CM | POA: Diagnosis not present

## 2018-09-18 DIAGNOSIS — Z1239 Encounter for other screening for malignant neoplasm of breast: Secondary | ICD-10-CM

## 2018-09-19 ENCOUNTER — Emergency Department
Admission: EM | Admit: 2018-09-19 | Discharge: 2018-09-19 | Disposition: A | Discharge status: Home / Self Care | Payer: BLUE CROSS/BLUE SHIELD | Attending: Emergency Medicine | Admitting: Emergency Medicine

## 2018-09-19 ENCOUNTER — Emergency Department: Payer: BLUE CROSS/BLUE SHIELD

## 2018-09-19 ENCOUNTER — Other Ambulatory Visit: Payer: Self-pay

## 2018-09-19 ENCOUNTER — Encounter: Payer: Self-pay | Admitting: Emergency Medicine

## 2018-09-19 DIAGNOSIS — S0181XA Laceration without foreign body of other part of head, initial encounter: Secondary | ICD-10-CM | POA: Insufficient documentation

## 2018-09-19 DIAGNOSIS — W101XXA Fall (on)(from) sidewalk curb, initial encounter: Secondary | ICD-10-CM | POA: Diagnosis not present

## 2018-09-19 DIAGNOSIS — S0990XA Unspecified injury of head, initial encounter: Secondary | ICD-10-CM

## 2018-09-19 DIAGNOSIS — Y929 Unspecified place or not applicable: Secondary | ICD-10-CM | POA: Insufficient documentation

## 2018-09-19 DIAGNOSIS — S0993XA Unspecified injury of face, initial encounter: Secondary | ICD-10-CM | POA: Diagnosis not present

## 2018-09-19 DIAGNOSIS — Y999 Unspecified external cause status: Secondary | ICD-10-CM | POA: Insufficient documentation

## 2018-09-19 DIAGNOSIS — Z79899 Other long term (current) drug therapy: Secondary | ICD-10-CM | POA: Diagnosis not present

## 2018-09-19 DIAGNOSIS — R51 Headache: Secondary | ICD-10-CM | POA: Diagnosis not present

## 2018-09-19 DIAGNOSIS — Y939 Activity, unspecified: Secondary | ICD-10-CM | POA: Insufficient documentation

## 2018-09-19 MED ORDER — ACETAMINOPHEN 500 MG PO TABS
1000.0000 mg | ORAL_TABLET | Freq: Once | ORAL | Status: AC
  Administered 2018-09-19: 1000 mg via ORAL
  Filled 2018-09-19: qty 2

## 2018-09-19 MED ORDER — LIDOCAINE-EPINEPHRINE-TETRACAINE (LET) SOLUTION
3.0000 mL | Freq: Once | NASAL | Status: AC
  Administered 2018-09-19: 3 mL via TOPICAL
  Filled 2018-09-19: qty 3

## 2018-09-19 MED ORDER — ONDANSETRON 4 MG PO TBDP
4.0000 mg | ORAL_TABLET | Freq: Once | ORAL | Status: AC
  Administered 2018-09-19: 4 mg via ORAL
  Filled 2018-09-19: qty 1

## 2018-09-19 MED ORDER — LIDOCAINE-EPINEPHRINE 1 %-1:200000 IJ SOLN
30.0000 mL | Freq: Once | INTRAMUSCULAR | Status: AC
Start: 2018-09-19 — End: 2018-09-19
  Administered 2018-09-19: 30 mL
  Filled 2018-09-19: qty 30

## 2018-09-19 NOTE — ED Triage Notes (Signed)
Says tripped and fell hitting head on curb. No loc.  Does feel nauseated now, but not vomiting.  Went to [facility] and they sent here here.

## 2018-09-19 NOTE — ED Notes (Signed)
See triage  note  States she tripped  Hit her head on curb  Laceration noted over right eye   Denies any LOC

## 2018-09-19 NOTE — Discharge Instructions (Signed)
Do not get the sutured area wet for 24 hours. After 24 hours, shower/bathe as usual and pat the area dry. Apply antibiotic ointment 2 times per day. See your PCP in 5 days for suture removal or sooner for signs or concern of infection.

## 2018-09-19 NOTE — ED Provider Notes (Signed)
[facility] Emergency Department Provider Note  ____________________________________________  Time seen: Approximately 10:47 AM  I have reviewed the triage vital signs and the nursing notes.   HISTORY  Chief Complaint Laceration   HPI Latasha Rios is a 57 y.o. female who presents to the emergency department for treatment and evaluation after a mechanical, nonsyncopal fall prior to arrival. She hit her face on the curb.  She denies neck pain. She was able to somewhat break her fall with her hands. She denies hand or wrist pain. She states that her right knee is scraped, but does not have pain with ambulation. She now has a mild headache and felt nauseated earlier.    Past Medical History:  Diagnosis Date  . Tinea capitis 07/08/2017    Patient Active Problem List   Diagnosis Date Noted  . Hyperlipidemia LDL goal <130 07/08/2017  . OSA (obstructive sleep apnea) 06/03/2016  . Vitamin D deficiency 05/28/2015  . Visit for preventive health examination 02/27/2013  . History of colonic polyps 12/20/2012    Past Surgical History:  Procedure Laterality Date  . BREAST SURGERY  2011   Reduction  . REDUCTION MAMMAPLASTY Bilateral 2014  . TONSILLECTOMY AND ADENOIDECTOMY  1965  . TUBAL LIGATION      Prior to Admission medications   Medication Sig Start Date End Date Taking? Authorizing Provider  ergocalciferol (DRISDOL) 50000 units capsule Take 1 capsule (50,000 Units total) by mouth once a week. 07/04/18   Sherlene Shams, MD  predniSONE (DELTASONE) 10 MG tablet 6 tablets on Day 1 , then reduce by 1 tablet daily until gone 07/03/18   Sherlene Shams, MD  triamcinolone cream (KENALOG) 0.1 % Apply 1 application topically 2 (two) times daily. 07/03/18   Sherlene Shams, MD    Allergies Patient has no known allergies.  Family History  Problem Relation Age of Onset  . Hypertension Father   . Heart disease Father 62       died of pneumonia  . Hypertension  Paternal Grandmother   . Diabetes Paternal Grandmother   . Hypertension Paternal Grandfather   . Diabetes Paternal Grandfather   . Cancer Mother        in situ breast   . Breast cancer Mother 38  . Hypertension Sister   . Down syndrome Brother   . Leukemia Brother        in remission   . Hypertension Brother     Social History Social History   Tobacco Use  . Smoking status: Never Smoker  . Smokeless tobacco: Never Used  Substance Use Topics  . Alcohol use: No  . Drug use: No    Review of Systems  Constitutional: Negative for fever. Respiratory: Negative for cough or shortness of breath.  Musculoskeletal: Negative for myalgias Skin: Positive for laceration to the right eyelid, eyebrow, forehead Neurological: Negative for numbness or paresthesias. ____________________________________________   PHYSICAL EXAM:  VITAL SIGNS: ED Triage Vitals  Enc Vitals Group     BP 09/19/18 0958 92/65     Pulse Rate 09/19/18 0958 68     Resp 09/19/18 0958 16     Temp 09/19/18 0958 97.9 F (36.6 C)     Temp Source 09/19/18 0958 Oral     SpO2 09/19/18 0958 97 %     Weight 09/19/18 0958 144 lb (65.3 kg)     Height 09/19/18 0958 5\' 6"  (1.676 m)     Head Circumference --  Peak Flow --      Pain Score 09/19/18 1001 5     Pain Loc --      Pain Edu? --      Excl. in GC? --      Constitutional: Well appearing. Eyes: Conjunctivae are clear without discharge or drainage.  No hyphema.  Eyelid was everted and no foreign body was found.  Conjunctiva appears to be clear Nose: No rhinorrhea noted. Mouth/Throat: Airway is patent.  Neck: No stridor. Unrestricted range of motion observed. Cardiovascular: Capillary refill is <3 seconds.  Respiratory: Respirations are even and unlabored.. Musculoskeletal: Unrestricted range of motion observed. Neurologic: Awake, alert, and oriented x 4.  Skin: 4 cm laceration to the right eyelid and eyebrow.  The portion of the laceration that is on the  eyelid is not through and through.  ____________________________________________   LABS (all labs ordered are listed, but only abnormal results are displayed)  Labs Reviewed - No data to display ____________________________________________  EKG  Not indicated. ____________________________________________  RADIOLOGY  CT head and maxillofacial negative for acute findings per radiology. ____________________________________________   PROCEDURES  .Marland KitchenLaceration Repair Date/Time: 09/19/2018 3:51 PM Performed by: Chinita Pester, FNP Authorized by: Chinita Pester, FNP   Consent:    Consent obtained:  Verbal   Consent given by:  Patient   Risks discussed:  Infection, pain, retained foreign body, poor cosmetic result and poor wound healing Anesthesia (see MAR for exact dosages):    Anesthesia method:  Local infiltration and topical application   Topical anesthetic:  LET   Local anesthetic:  Lidocaine 1% WITH epi Laceration details:    Location:  Face   Face location:  Forehead   Length (cm):  4 Repair type:    Repair type:  Complex Pre-procedure details:    Preparation:  Patient was prepped and draped in usual sterile fashion Exploration:    Hemostasis achieved with:  Direct pressure and LET   Wound exploration: entire depth of wound probed and visualized     Contaminated: no   Treatment:    Area cleansed with:  Saline and Betadine   Amount of cleaning:  Extensive   Irrigation solution:  Sterile saline   Irrigation method:  Syringe   Visualized foreign bodies/material removed: no     Debridement:  None Subcutaneous repair:    Suture size:  5-0   Suture material:  Fast-absorbing gut   Number of sutures:  2 Skin repair:    Repair method:  Sutures   Suture size:  7-0   Suture material:  Prolene   Number of sutures:  7 Approximation:    Approximation:  Close Post-procedure details:    Dressing:  Antibiotic ointment   Patient tolerance of procedure:  Tolerated  well, no immediate complications  ____________________________________________   INITIAL IMPRESSION / ASSESSMENT AND PLAN / ED COURSE  Latasha Rios is a 57 y.o. female who presents to the emergency department for treatment and evaluation after sustaining a head injury and laceration.  Wound was cleaned and sutured as described above.  Wound edges reapproximated easily.  The patient is concerned about scarring and plans to follow-up with Dr. Gwen Pounds in dermatology.  She is aware that she will need to have the sutures removed in 5 days.  Wound care was also discussed.  Head injury instructions will be provided as well.  Her sister is going to stay with her for the next 24 hours since her husband is out of town.  Sister  was given a list of head injury instructions.  She is to return with the patient to the emergency department immediately for any concerns.  Medications  lidocaine-EPINEPHrine (XYLOCAINE-EPINEPHrine) 1 %-1:200000 (PF) injection 30 mL (30 mLs Infiltration Given by Other 09/19/18 1119)  lidocaine-EPINEPHrine-tetracaine (LET) solution (3 mLs Topical Given by Other 09/19/18 1119)  acetaminophen (TYLENOL) tablet 1,000 mg (1,000 mg Oral Given 09/19/18 1206)  ondansetron (ZOFRAN-ODT) disintegrating tablet 4 mg (4 mg Oral Given 09/19/18 1206)     Pertinent labs & imaging results that were available during my care of the patient were reviewed by me and considered in my medical decision making (see chart for details).  ____________________________________________   FINAL CLINICAL IMPRESSION(S) / ED DIAGNOSES  Final diagnoses:  Injury of head, initial encounter  Laceration of forehead, initial encounter    ED Discharge Orders    None       Note:  This document was prepared using Dragon voice recognition software and may include unintentional dictation errors.    Chinita Pester, FNP 09/19/18 1601    Governor Rooks, MD 09/21/18 [id number]

## 2018-09-21 ENCOUNTER — Other Ambulatory Visit: Payer: Self-pay | Admitting: Internal Medicine

## 2018-09-23 DIAGNOSIS — G4733 Obstructive sleep apnea (adult) (pediatric): Secondary | ICD-10-CM | POA: Diagnosis not present

## 2018-09-25 NOTE — Telephone Encounter (Signed)
Refilled for 6 months, then start using otc vitami D 3 2000 ius daily

## 2018-09-25 NOTE — Telephone Encounter (Signed)
Okay to refill, or should pt be on OTC Vit D now? 

## 2018-09-26 ENCOUNTER — Encounter: Payer: Self-pay | Admitting: *Deleted

## 2018-09-26 NOTE — Telephone Encounter (Signed)
Sent mychart message

## 2018-10-14 DIAGNOSIS — M5416 Radiculopathy, lumbar region: Secondary | ICD-10-CM | POA: Diagnosis not present

## 2018-10-16 ENCOUNTER — Telehealth: Payer: Self-pay

## 2018-10-16 NOTE — Telephone Encounter (Signed)
Copied from CRM [id number]. Topic: General - Call Back - No Documentation >> Oct 16, 2018 11:00 AM Darletta MollLander, Lumin L wrote: Reason for CRM: Patient missed call.   **I called patient & stated that I didn't see a reason for call. I informed her that it may be a recording reminding her to get her flu shot.**

## 2018-10-28 DIAGNOSIS — M5416 Radiculopathy, lumbar region: Secondary | ICD-10-CM | POA: Diagnosis not present

## 2018-11-04 DIAGNOSIS — M5416 Radiculopathy, lumbar region: Secondary | ICD-10-CM | POA: Diagnosis not present

## 2018-11-13 DIAGNOSIS — M5416 Radiculopathy, lumbar region: Secondary | ICD-10-CM | POA: Diagnosis not present

## 2019-01-01 DIAGNOSIS — G4733 Obstructive sleep apnea (adult) (pediatric): Secondary | ICD-10-CM | POA: Diagnosis not present

## 2019-01-07 DIAGNOSIS — M5416 Radiculopathy, lumbar region: Secondary | ICD-10-CM | POA: Diagnosis not present

## 2019-01-07 DIAGNOSIS — M48062 Spinal stenosis, lumbar region with neurogenic claudication: Secondary | ICD-10-CM | POA: Diagnosis not present

## 2019-01-07 DIAGNOSIS — M5126 Other intervertebral disc displacement, lumbar region: Secondary | ICD-10-CM | POA: Diagnosis not present

## 2019-01-07 DIAGNOSIS — R030 Elevated blood-pressure reading, without diagnosis of hypertension: Secondary | ICD-10-CM | POA: Diagnosis not present

## 2019-04-22 DIAGNOSIS — Z1159 Encounter for screening for other viral diseases: Secondary | ICD-10-CM | POA: Diagnosis not present

## 2019-04-23 DIAGNOSIS — G4733 Obstructive sleep apnea (adult) (pediatric): Secondary | ICD-10-CM | POA: Diagnosis not present

## 2019-04-28 DIAGNOSIS — M5126 Other intervertebral disc displacement, lumbar region: Secondary | ICD-10-CM | POA: Diagnosis not present

## 2019-07-14 ENCOUNTER — Telehealth: Payer: Self-pay | Admitting: Internal Medicine

## 2019-07-16 ENCOUNTER — Encounter: Payer: BLUE CROSS/BLUE SHIELD | Admitting: Internal Medicine

## 2019-08-19 ENCOUNTER — Other Ambulatory Visit: Payer: Self-pay

## 2019-08-19 DIAGNOSIS — M5126 Other intervertebral disc displacement, lumbar region: Secondary | ICD-10-CM | POA: Diagnosis not present

## 2019-08-19 DIAGNOSIS — R030 Elevated blood-pressure reading, without diagnosis of hypertension: Secondary | ICD-10-CM | POA: Diagnosis not present

## 2019-08-21 ENCOUNTER — Encounter: Payer: Self-pay | Admitting: Internal Medicine

## 2019-08-21 ENCOUNTER — Other Ambulatory Visit: Payer: Self-pay

## 2019-08-21 ENCOUNTER — Ambulatory Visit (INDEPENDENT_AMBULATORY_CARE_PROVIDER_SITE_OTHER): Payer: BC Managed Care – PPO | Admitting: Internal Medicine

## 2019-08-21 ENCOUNTER — Other Ambulatory Visit (HOSPITAL_COMMUNITY)
Admission: RE | Admit: 2019-08-21 | Discharge: 2019-08-21 | Disposition: A | Discharge status: Home / Self Care | Payer: BC Managed Care – PPO | Source: Ambulatory Visit | Attending: Internal Medicine | Admitting: Internal Medicine

## 2019-08-21 VITALS — BP 136/88 | HR 94 | Temp 97.7°F | Resp 15 | Ht 66.0 in | Wt 146.0 lb

## 2019-08-21 DIAGNOSIS — Z0000 Encounter for general adult medical examination without abnormal findings: Secondary | ICD-10-CM

## 2019-08-21 DIAGNOSIS — Z124 Encounter for screening for malignant neoplasm of cervix: Secondary | ICD-10-CM

## 2019-08-21 DIAGNOSIS — Z7189 Other specified counseling: Secondary | ICD-10-CM | POA: Diagnosis not present

## 2019-08-21 DIAGNOSIS — Z9889 Other specified postprocedural states: Secondary | ICD-10-CM | POA: Insufficient documentation

## 2019-08-21 DIAGNOSIS — Z1239 Encounter for other screening for malignant neoplasm of breast: Secondary | ICD-10-CM | POA: Diagnosis not present

## 2019-08-21 DIAGNOSIS — E785 Hyperlipidemia, unspecified: Secondary | ICD-10-CM

## 2019-08-21 DIAGNOSIS — Z23 Encounter for immunization: Secondary | ICD-10-CM | POA: Diagnosis not present

## 2019-08-21 LAB — COMPREHENSIVE METABOLIC PANEL
ALT: 14 U/L (ref 0–35)
AST: 17 U/L (ref 0–37)
Albumin: 4.7 g/dL (ref 3.5–5.2)
Alkaline Phosphatase: 75 U/L (ref 39–117)
BUN: 15 mg/dL (ref 6–23)
CO2: 29 mEq/L (ref 19–32)
Calcium: 9.6 mg/dL (ref 8.4–10.5)
Chloride: 103 mEq/L (ref 96–112)
Creatinine, Ser: 0.58 mg/dL (ref 0.40–1.20)
GFR: 106.13 mL/min (ref >60.00)
Glucose, Bld: 87 mg/dL (ref 70–99)
Potassium: 4.2 mEq/L (ref 3.5–5.1)
Sodium: 140 mEq/L (ref 135–145)
Total Bilirubin: 0.5 mg/dL (ref 0.2–1.2)
Total Protein: 7.1 g/dL (ref 6.0–8.3)

## 2019-08-21 LAB — CBC WITH DIFFERENTIAL/PLATELET
Basophils Absolute: 0 10*3/uL (ref 0.0–0.1)
Basophils Relative: 0.7 % (ref 0.0–3.0)
Eosinophils Absolute: 0.2 10*3/uL (ref 0.0–0.7)
Eosinophils Relative: 3.3 % (ref 0.0–5.0)
HCT: 41.1 % (ref 36.0–46.0)
Hemoglobin: 13.7 g/dL (ref 12.0–15.0)
Lymphocytes Relative: 28.7 % (ref 12.0–46.0)
Lymphs Abs: 1.6 10*3/uL (ref 0.7–4.0)
MCHC: 33.4 g/dL (ref 30.0–36.0)
MCV: 93.4 fl (ref 78.0–100.0)
Monocytes Absolute: 0.6 10*3/uL (ref 0.1–1.0)
Monocytes Relative: 10.7 % (ref 3.0–12.0)
Neutro Abs: 3.2 10*3/uL (ref 1.4–7.7)
Neutrophils Relative %: 56.6 % (ref 43.0–77.0)
Platelets: 333 10*3/uL (ref 150.0–400.0)
RBC: 4.4 Mil/uL (ref 3.87–5.11)
RDW: 12.4 % (ref 11.5–15.5)
WBC: 5.6 10*3/uL (ref 4.0–10.5)

## 2019-08-21 LAB — LIPID PANEL
Cholesterol: 225 mg/dL — ABNORMAL HIGH (ref 0–200)
HDL: 43.7 mg/dL (ref >39.00)
LDL Cholesterol: 164 mg/dL — ABNORMAL HIGH (ref 0–99)
NonHDL: 180.83
Total CHOL/HDL Ratio: 5
Triglycerides: 86 mg/dL (ref 0.0–149.0)
VLDL: 17.2 mg/dL (ref 0.0–40.0)

## 2019-08-21 LAB — TSH: TSH: 0.45 u[IU]/mL (ref 0.35–4.50)

## 2019-08-21 NOTE — Patient Instructions (Signed)

## 2019-08-21 NOTE — Assessment & Plan Note (Signed)

## 2019-08-21 NOTE — Progress Notes (Addendum)
Patient ID: Latasha Rios, female    DOB: 14-Jun-1961  Age: 58 y.o. MRN: [id number]  The patient is here for annual   preventive  examination and management of other chronic and acute problems.  NEEDS COVID SCREENING PRIOR TO FLIGHT TO [place] ON OCT 23    The risk factors are reflected in the social history.  The roster of all physicians providing medical care to patient - is listed in the Snapshot section of the chart.  Activities of daily living:  The patient is 100% independent in all ADLs: dressing, toileting, feeding as well as independent mobility  Home safety : The patient has smoke detectors in the home. They wear seatbelts.  There are no firearms at home. There is no violence in the home.   There is no risks for hepatitis, STDs or HIV. There is no   history of blood transfusion. They have no travel history to infectious disease endemic areas of the world.  The patient has seen their dentist in the last six month. They have seen their eye doctor in the last year.  They do not  have excessive sun exposure. Discussed the need for sun protection: hats, long sleeves and use of sunscreen if there is significant sun exposure.   Diet: the importance of a healthy diet is discussed. They do have a healthy diet.  The benefits of regular aerobic exercise were discussed. She walks 4 times per week ,  20 minutes.   Depression screen: there are no signs or vegative symptoms of depression- irritability, change in appetite, anhedonia, sadness/tearfullness.  Cognitive assessment: the patient manages all their financial and personal affairs and is actively engaged. They could relate day,date,year and events; recalled 2/3 objects at 3 minutes; performed clock-face test normally.  The following portions of the patient's history were reviewed and updated as appropriate: allergies, current medications, past family history, past medical history,  past surgical history, past social history  and problem  list.  Visual acuity was not assessed per patient preference since she has regular follow up with her ophthalmologist. Hearing and body mass index were assessed and reviewed.   During the course of the visit the patient was educated and counseled about appropriate screening and preventive services including : fall prevention , diabetes screening, nutrition counseling, colorectal cancer screening, and recommended immunizations.    CC: The primary encounter diagnosis was Screening for cervical cancer. Diagnoses of Breast cancer screening, Visit for preventive health examination, Advice Given About Covid-19 Virus Infection, Encounter for preventive health examination, Need for immunization against influenza, S/P lumbar discectomy, and Hyperlipidemia LDL goal <130 were also pertinent to this visit.   Had a fall In October  Resulting in a facial laceration over her right eyebrow when she tripped and struck her face on the curb.  She was treated in ER by an RN with sutures.  but had asked to see a plastic surgeon but was evaluated afterward .  She underwent a lumbar diskectomy by jeff jenkins after an ESI in [place] had failed to relieve pain. History Latasha Rios has a past medical history of Tinea capitis (07/08/2017).   She has a past surgical history that includes Breast surgery (2011); Tonsillectomy and adenoidectomy (1965); Tubal ligation; and Reduction mammaplasty (Bilateral, 2014).   Her family history includes Breast cancer (age of onset: 41) in her mother; Cancer in her mother; Diabetes in her paternal grandfather and paternal grandmother; Down syndrome in her brother; Heart disease (age of onset: 88) in her father;  Hypertension in her brother, father, paternal grandfather, paternal grandmother, and sister; Leukemia in her brother.She reports that she has never smoked. She has never used smokeless tobacco. She reports that she does not drink alcohol or use drugs.  Outpatient Medications Prior to Visit   Medication Sig Dispense Refill  . predniSONE (DELTASONE) 10 MG tablet 6 tablets on Day 1 , then reduce by 1 tablet daily until gone (Patient not taking: Reported on 08/21/2019) 21 tablet 0  . triamcinolone cream (KENALOG) 0.1 % Apply 1 application topically 2 (two) times daily. 30 g 2  . Vitamin D, Ergocalciferol, (DRISDOL) 50000 units CAPS capsule TAKE ONE CAPSULE BY MOUTH ONE TIME PER WEEK 12 capsule 1   No facility-administered medications prior to visit.     Review of Systems   Patient denies headache, fevers, malaise, unintentional weight loss, skin rash, eye pain, sinus congestion and sinus pain, sore throat, dysphagia,  hemoptysis , cough, dyspnea, wheezing, chest pain, palpitations, orthopnea, edema, abdominal pain, nausea, melena, diarrhea, constipation, flank pain, dysuria, hematuria, urinary  Frequency, nocturia, numbness, tingling, seizures,  Focal weakness, Loss of consciousness,  Tremor, insomnia, depression, anxiety, and suicidal ideation.      Objective:  BP 136/88 (BP Location: Left Arm, Patient Position: Sitting, Cuff Size: Normal)   Pulse 94   Temp 97.7 F (36.5 C) (Temporal)   Resp 15   Ht 5\' 6"  (1.676 m)   Wt 146 lb (66.2 kg)   SpO2 98%   BMI 23.57 kg/m   Physical Exam  General Appearance:    Alert, cooperative, no distress, appears stated age  Head:    Normocephalic, without obvious abnormality, atraumatic  Eyes:    PERRL, conjunctiva/corneas clear, EOM's intact, fundi    benign, both eyes  Ears:    Normal TM's and external ear canals, both ears  Nose:   Nares normal, septum midline, mucosa normal, no drainage    or sinus tenderness  Throat:   Lips, mucosa, and tongue normal; teeth and gums normal  Neck:   Supple, symmetrical, trachea midline, no adenopathy;    thyroid:  no enlargement/tenderness/nodules; no carotid   bruit or JVD  Back:     Symmetric, no curvature, ROM normal, no CVA tenderness  Lungs:     Clear to auscultation bilaterally, respirations  unlabored  Chest Wall:    No tenderness or deformity   Heart:    Regular rate and rhythm, S1 and S2 normal, no murmur, rub   or gallop  Breast Exam:    No tenderness, masses, or nipple abnormality  Abdomen:     Soft, non-tender, bowel sounds active all four quadrants,    no masses, no organomegaly  Genitalia:    Pelvic: cervix normal in appearance, external genitalia normal, no adnexal masses or tenderness, no cervical motion tenderness, rectovaginal septum normal, uterus normal size, shape, and consistency and vagina normal without discharge  Extremities:   Extremities normal, atraumatic, no cyanosis or edema  Pulses:   2+ and symmetric all extremities  Skin:   Skin color, texture, turgor normal, no rashes or lesions  Lymph nodes:   Cervical, supraclavicular, and axillary nodes normal  Neurologic:   CNII-XII intact, normal strength, sensation and reflexes    throughout    Assessment & Plan:   Problem List Items Addressed This Visit      Unprioritized   Visit for preventive health examination    age appropriate education and counseling updated, referrals for preventative services and immunizations addressed, dietary and  smoking counseling addressed, most recent labs reviewed.  I have personally reviewed and have noted:  1) the patient's medical and social history 2) The pt's use of alcohol, tobacco, and illicit drugs 3) The patient's current medications and supplements 4) Functional ability including ADL's, fall risk, home safety risk, hearing and visual impairment 5) Diet and physical activities 6) Evidence for depression or mood disorder 7) The patient's height, weight, and BMI have been recorded in the chart  I have made referrals, and provided counseling and education based on review of the above      S/P lumbar discectomy    Done in June  By Lennon AlstromJJ to relieve persistent sciatica.       Hyperlipidemia LDL goal <130    Using the Framingham risk calculator,  her 10 year risk  of coronary artery disease is <10%.  No therapy warranted based on recent guideline changes    Lab Results  Component Value Date   CHOL 225 (H) 08/21/2019   HDL 43.70 08/21/2019   LDLCALC 164 (H) 08/21/2019   LDLDIRECT 193.0 05/31/2016   TRIG 86.0 08/21/2019   CHOLHDL 5 08/21/2019          Other Visit Diagnoses    Screening for cervical cancer    -  Primary   Relevant Orders   Cytology - PAP( [facility])   Breast cancer screening       Relevant Orders   MM 3D SCREEN BREAST BILATERAL   Advice Given About Covid-19 Virus Infection       Relevant Orders   Novel Coronavirus, NAA ([facility])   Encounter for preventive health examination       Relevant Orders   Comprehensive metabolic panel (Completed)   Lipid panel (Completed)   TSH (Completed)   CBC with Differential/Platelet (Completed)   Need for immunization against influenza       Relevant Orders   Flu Vaccine QUAD 36+ mos IM (Completed)      I have discontinued Latasha Rios's predniSONE, triamcinolone cream, and Vitamin D (Ergocalciferol).  No orders of the defined types were placed in this encounter.   Medications Discontinued During This Encounter  Medication Reason  . predniSONE (DELTASONE) 10 MG tablet Completed Course  . triamcinolone cream (KENALOG) 0.1 % Patient has not taken in last 30 days  . Vitamin D, Ergocalciferol, (DRISDOL) 50000 units CAPS capsule Completed Course    Follow-up: No follow-ups on file.   Latasha Shamseresa L [facility], MD

## 2019-08-21 NOTE — Assessment & Plan Note (Signed)
Done in June  By Dewitt Hoes to relieve persistent sciatica.

## 2019-08-23 NOTE — Assessment & Plan Note (Addendum)
Using the Framingham risk calculator,  her 10 year risk of coronary artery disease is <10%.  No therapy warranted based on recent guideline changes    Lab Results  Component Value Date   CHOL 225 (H) 08/21/2019   HDL 43.70 08/21/2019   LDLCALC 164 (H) 08/21/2019   LDLDIRECT 193.0 05/31/2016   TRIG 86.0 08/21/2019   CHOLHDL 5 08/21/2019

## 2019-08-25 LAB — CYTOLOGY - PAP
Adequacy: ABSENT
Diagnosis: NEGATIVE
High risk HPV: NEGATIVE
Molecular Disclaimer: 56
Molecular Disclaimer: NORMAL

## 2019-08-29 ENCOUNTER — Encounter: Payer: Self-pay | Admitting: Internal Medicine

## 2019-09-09 DIAGNOSIS — G4733 Obstructive sleep apnea (adult) (pediatric): Secondary | ICD-10-CM | POA: Diagnosis not present

## 2019-09-17 ENCOUNTER — Other Ambulatory Visit: Payer: Self-pay

## 2019-09-17 DIAGNOSIS — Z20822 Contact with and (suspected) exposure to covid-19: Secondary | ICD-10-CM

## 2019-09-17 DIAGNOSIS — Z20828 Contact with and (suspected) exposure to other viral communicable diseases: Secondary | ICD-10-CM | POA: Diagnosis not present

## 2019-09-18 LAB — NOVEL CORONAVIRUS, NAA: SARS-CoV-2, NAA: NOT DETECTED

## 2019-09-23 DIAGNOSIS — H524 Presbyopia: Secondary | ICD-10-CM | POA: Diagnosis not present

## 2019-10-27 ENCOUNTER — Other Ambulatory Visit: Payer: Self-pay

## 2019-10-27 DIAGNOSIS — Z20822 Contact with and (suspected) exposure to covid-19: Secondary | ICD-10-CM

## 2019-10-29 LAB — NOVEL CORONAVIRUS, NAA: SARS-CoV-2, NAA: NOT DETECTED

## 2019-12-10 ENCOUNTER — Ambulatory Visit: Payer: BC Managed Care – PPO | Attending: Internal Medicine

## 2019-12-10 DIAGNOSIS — G4733 Obstructive sleep apnea (adult) (pediatric): Secondary | ICD-10-CM | POA: Diagnosis not present

## 2019-12-10 DIAGNOSIS — Z20822 Contact with and (suspected) exposure to covid-19: Secondary | ICD-10-CM

## 2019-12-11 LAB — NOVEL CORONAVIRUS, NAA: SARS-CoV-2, NAA: NOT DETECTED

## 2020-03-15 ENCOUNTER — Telehealth: Payer: Self-pay | Admitting: Internal Medicine

## 2020-03-15 MED ORDER — PREDNISONE 10 MG PO TABS: ORAL_TABLET | ORAL | 0 refills | Status: DC

## 2020-03-15 NOTE — Telephone Encounter (Signed)
Prednisone taper sent to [facility].

## 2020-03-15 NOTE — Telephone Encounter (Signed)
Pt states that she has poison ivy/oak and that she usually gets it once a year. Arms, chin, ears-She wants to know if she can have a rx of prednisone called into [facility] on [address]. There were no appts avail this week.

## 2020-03-15 NOTE — Telephone Encounter (Signed)
Left a detailed message letting pt know that the prednisone taper has been sent in to [facility].

## 2020-03-26 DIAGNOSIS — G4733 Obstructive sleep apnea (adult) (pediatric): Secondary | ICD-10-CM | POA: Diagnosis not present

## 2020-05-22 DIAGNOSIS — J00 Acute nasopharyngitis [common cold]: Secondary | ICD-10-CM | POA: Diagnosis not present

## 2020-07-28 ENCOUNTER — Ambulatory Visit
Admission: RE | Admit: 2020-07-28 | Discharge: 2020-07-28 | Disposition: A | Discharge status: Home / Self Care | Payer: BC Managed Care – PPO | Source: Ambulatory Visit | Attending: Internal Medicine | Admitting: Internal Medicine

## 2020-07-28 ENCOUNTER — Other Ambulatory Visit: Payer: Self-pay

## 2020-07-28 DIAGNOSIS — Z1231 Encounter for screening mammogram for malignant neoplasm of breast: Secondary | ICD-10-CM | POA: Diagnosis not present

## 2020-07-28 DIAGNOSIS — Z1239 Encounter for other screening for malignant neoplasm of breast: Secondary | ICD-10-CM

## 2020-08-25 ENCOUNTER — Other Ambulatory Visit: Payer: Self-pay

## 2020-08-26 ENCOUNTER — Other Ambulatory Visit (HOSPITAL_COMMUNITY)
Admission: RE | Admit: 2020-08-26 | Discharge: 2020-08-26 | Disposition: A | Discharge status: Home / Self Care | Payer: BC Managed Care – PPO | Source: Ambulatory Visit | Attending: Internal Medicine | Admitting: Internal Medicine

## 2020-08-26 ENCOUNTER — Ambulatory Visit (INDEPENDENT_AMBULATORY_CARE_PROVIDER_SITE_OTHER): Payer: BC Managed Care – PPO | Admitting: Internal Medicine

## 2020-08-26 ENCOUNTER — Encounter: Payer: Self-pay | Admitting: Internal Medicine

## 2020-08-26 VITALS — BP 112/84 | HR 87 | Temp 98.5°F | Resp 14 | Ht 66.0 in | Wt 142.2 lb

## 2020-08-26 DIAGNOSIS — Z0000 Encounter for general adult medical examination without abnormal findings: Secondary | ICD-10-CM

## 2020-08-26 DIAGNOSIS — Z124 Encounter for screening for malignant neoplasm of cervix: Secondary | ICD-10-CM | POA: Insufficient documentation

## 2020-08-26 DIAGNOSIS — Z114 Encounter for screening for human immunodeficiency virus [HIV]: Secondary | ICD-10-CM | POA: Diagnosis not present

## 2020-08-26 DIAGNOSIS — E785 Hyperlipidemia, unspecified: Secondary | ICD-10-CM

## 2020-08-26 LAB — COMPREHENSIVE METABOLIC PANEL
ALT: 12 U/L (ref 0–35)
AST: 15 U/L (ref 0–37)
Albumin: 4.6 g/dL (ref 3.5–5.2)
Alkaline Phosphatase: 79 U/L (ref 39–117)
BUN: 16 mg/dL (ref 6–23)
CO2: 29 mEq/L (ref 19–32)
Calcium: 9.4 mg/dL (ref 8.4–10.5)
Chloride: 104 mEq/L (ref 96–112)
Creatinine, Ser: 0.56 mg/dL (ref 0.40–1.20)
GFR: 110.13 mL/min (ref >60.00)
Glucose, Bld: 90 mg/dL (ref 70–99)
Potassium: 4.1 mEq/L (ref 3.5–5.1)
Sodium: 139 mEq/L (ref 135–145)
Total Bilirubin: 0.6 mg/dL (ref 0.2–1.2)
Total Protein: 7.2 g/dL (ref 6.0–8.3)

## 2020-08-26 LAB — LIPID PANEL
Cholesterol: 236 mg/dL — ABNORMAL HIGH (ref 0–200)
HDL: 46.6 mg/dL (ref >39.00)
LDL Cholesterol: 168 mg/dL — ABNORMAL HIGH (ref 0–99)
NonHDL: 189.21
Total CHOL/HDL Ratio: 5
Triglycerides: 104 mg/dL (ref 0.0–149.0)
VLDL: 20.8 mg/dL (ref 0.0–40.0)

## 2020-08-26 NOTE — Patient Instructions (Signed)
I recommend that your daughter(s) get BRCA testing  (or you can get tested,   if you think it will make her too anxious)  If you want me to refer you,  Let me know   Health Maintenance for Postmenopausal Women Menopause is a normal process in which your ability to get pregnant comes to an end. This process happens slowly over many months or years, usually between the ages of 49 and 25. Menopause is complete when you have missed your menstrual periods for 12 months. It is important to talk with your health care provider about some of the most common conditions that affect women after menopause (postmenopausal women). These include heart disease, cancer, and bone loss (osteoporosis). Adopting a healthy lifestyle and getting preventive care can help to promote your health and wellness. The actions you take can also lower your chances of developing some of these common conditions. What should I know about menopause? During menopause, you may get a number of symptoms, such as:  Hot flashes. These can be moderate or severe.  Night sweats.  Decrease in sex drive.  Mood swings.  Headaches.  Tiredness.  Irritability.  Memory problems.  Insomnia. Choosing to treat or not to treat these symptoms is a decision that you make with your health care provider. Do I need hormone replacement therapy?  Hormone replacement therapy is effective in treating symptoms that are caused by menopause, such as hot flashes and night sweats.  Hormone replacement carries certain risks, especially as you become older. If you are thinking about using estrogen or estrogen with progestin, discuss the benefits and risks with your health care provider. What is my risk for heart disease and stroke? The risk of heart disease, heart attack, and stroke increases as you age. One of the causes may be a change in the body's hormones during menopause. This can affect how your body uses dietary fats, triglycerides, and  cholesterol. Heart attack and stroke are medical emergencies. There are many things that you can do to help prevent heart disease and stroke. Watch your blood pressure  High blood pressure causes heart disease and increases the risk of stroke. This is more likely to develop in people who have high blood pressure readings, are of African descent, or are overweight.  Have your blood pressure checked: ? Every 3-5 years if you are 7-89 years of age. ? Every year if you are 54 years old or older. Eat a healthy diet   Eat a diet that includes plenty of vegetables, fruits, low-fat dairy products, and lean protein.  Do not eat a lot of foods that are high in solid fats, added sugars, or sodium. Get regular exercise Get regular exercise. This is one of the most important things you can do for your health. Most adults should:  Try to exercise for at least 150 minutes each week. The exercise should increase your heart rate and make you sweat (moderate-intensity exercise).  Try to do strengthening exercises at least twice each week. Do these in addition to the moderate-intensity exercise.  Spend less time sitting. Even light physical activity can be beneficial. Other tips  Work with your health care provider to achieve or maintain a healthy weight.  Do not use any products that contain nicotine or tobacco, such as cigarettes, e-cigarettes, and chewing tobacco. If you need help quitting, ask your health care provider.  Know your numbers. Ask your health care provider to check your cholesterol and your blood sugar (glucose). Continue to  have your blood tested as directed by your health care provider. Do I need screening for cancer? Depending on your health history and family history, you may need to have cancer screening at different stages of your life. This may include screening for:  Breast cancer.  Cervical cancer.  Lung cancer.  Colorectal cancer. What is my risk for  osteoporosis? After menopause, you may be at increased risk for osteoporosis. Osteoporosis is a condition in which bone destruction happens more quickly than new bone creation. To help prevent osteoporosis or the bone fractures that can happen because of osteoporosis, you may take the following actions:  If you are 24-40 years old, get at least 1,000 mg of calcium and at least 600 mg of vitamin D per day.  If you are older than age 81 but younger than age 13, get at least 1,200 mg of calcium and at least 600 mg of vitamin D per day.  If you are older than age 28, get at least 1,200 mg of calcium and at least 800 mg of vitamin D per day. Smoking and drinking excessive alcohol increase the risk of osteoporosis. Eat foods that are rich in calcium and vitamin D, and do weight-bearing exercises several times each week as directed by your health care provider. How does menopause affect my mental health? Depression may occur at any age, but it is more common as you become older. Common symptoms of depression include:  Low or sad mood.  Changes in sleep patterns.  Changes in appetite or eating patterns.  Feeling an overall lack of motivation or enjoyment of activities that you previously enjoyed.  Frequent crying spells. Talk with your health care provider if you think that you are experiencing depression. General instructions See your health care provider for regular wellness exams and vaccines. This may include:  Scheduling regular health, dental, and eye exams.  Getting and maintaining your vaccines. These include: ? Influenza vaccine. Get this vaccine each year before the flu season begins. ? Pneumonia vaccine. ? Shingles vaccine. ? Tetanus, diphtheria, and pertussis (Tdap) booster vaccine. Your health care provider may also recommend other immunizations. Tell your health care provider if you have ever been abused or do not feel safe at home. Summary  Menopause is a normal process in  which your ability to get pregnant comes to an end.  This condition causes hot flashes, night sweats, decreased interest in sex, mood swings, headaches, or lack of sleep.  Treatment for this condition may include hormone replacement therapy.  Take actions to keep yourself healthy, including exercising regularly, eating a healthy diet, watching your weight, and checking your blood pressure and blood sugar levels.  Get screened for cancer and depression. Make sure that you are up to date with all your vaccines. This information is not intended to replace advice given to you by your health care provider. Make sure you discuss any questions you have with your health care provider. Document Revised: 11/06/2018 Document Reviewed: 11/06/2018 Elsevier Patient Education  2020 [employer].

## 2020-08-26 NOTE — Progress Notes (Signed)
Patient ID: Latasha Rios, female    DOB: Mar 21, 1961  Age: 59 y.o. MRN: [id number]  The patient is here for annual preventive examination and management of other chronic and acute problems.   The risk factors are reflected in the social history.  The roster of all physicians providing medical care to patient - is listed in the Snapshot section of the chart.  Activities of daily living:  The patient is 100% independent in all ADLs: dressing, toileting, feeding as well as independent mobility  Home safety : The patient has smoke detectors in the home. They wear seatbelts.  There are no firearms at home. There is no violence in the home.   There is no risks for hepatitis, STDs or HIV. There is no   history of blood transfusion. They have no travel history to infectious disease endemic areas of the world.  The patient has seen their dentist in the last six month. They have seen their eye doctor in the last year. They admit to slight hearing difficulty with regard to whispered voices and some television programs.  They have deferred audiologic testing in the last year.  They do not  have excessive sun exposure. Discussed the need for sun protection: hats, long sleeves and use of sunscreen if there is significant sun exposure.   Diet: the importance of a healthy diet is discussed. They do have a healthy diet.  The benefits of regular aerobic exercise were discussed. She walks 4 times per week ,  20 minutes.   Depression screen: there are no signs or vegative symptoms of depression- irritability, change in appetite, anhedonia, sadness/tearfullness.  The following portions of the patient's history were reviewed and updated as appropriate: allergies, current medications, past family history, past medical history,  past surgical history, past social history  and problem list.  Visual acuity was not assessed per patient preference since she has regular follow up with her ophthalmologist. Hearing and  body mass index were assessed and reviewed.   During the course of the visit the patient was educated and counseled about appropriate screening and preventive services including : fall prevention , diabetes screening, nutrition counseling, colorectal cancer screening, and recommended immunizations.    CC: The primary encounter diagnosis was Pap smear for cervical cancer screening. Diagnoses of Hyperlipidemia LDL goal <130, Screening for HIV without presence of risk factors, and Visit for preventive health examination were also pertinent to this visit.  History Zenita has a past medical history of Tinea capitis (07/08/2017).   She has a past surgical history that includes Breast surgery (2011); Tonsillectomy and adenoidectomy (1965); Tubal ligation; and Reduction mammaplasty (Bilateral, 2014).   Her family history includes Breast cancer (age of onset: 56) in her sister; Breast cancer (age of onset: 38) in her mother; Cancer in her mother; Diabetes in her paternal grandfather and paternal grandmother; Down syndrome in her brother; Heart disease (age of onset: 67) in her father; Hypertension in her brother, father, paternal grandfather, paternal grandmother, and sister; Leukemia in her brother.She reports that she has never smoked. She has never used smokeless tobacco. She reports that she does not drink alcohol and does not use drugs.  Outpatient Medications Prior to Visit  Medication Sig Dispense Refill   predniSONE (DELTASONE) 10 MG tablet 6 tablets on Day 1 , then reduce by 1 tablet daily until gone (Patient not taking: Reported on 08/26/2020) 21 tablet 0   No facility-administered medications prior to visit.    Review of Systems  Patient denies headache, fevers, malaise, unintentional weight loss, skin rash, eye pain, sinus congestion and sinus pain, sore throat, dysphagia,  hemoptysis , cough, dyspnea, wheezing, chest pain, palpitations, orthopnea, edema, abdominal pain, nausea, melena,  diarrhea, constipation, flank pain, dysuria, hematuria, urinary  Frequency, nocturia, numbness, tingling, seizures,  Focal weakness, Loss of consciousness,  Tremor, insomnia, depression, anxiety, and suicidal ideation.      Objective:  BP 112/84 (BP Location: Left Arm, Patient Position: Sitting, Cuff Size: Normal)    Pulse 87    Temp 98.5 F (36.9 C) (Oral)    Resp 14    Ht 5\' 6"  (1.676 m)    Wt 142 lb 3.2 oz (64.5 kg)    LMP 12/28/2014 (Approximate)    SpO2 98%    BMI 22.95 kg/m   Physical Exam  General Appearance:    Alert, cooperative, no distress, appears stated age  Head:    Normocephalic, without obvious abnormality, atraumatic  Eyes:    PERRL, conjunctiva/corneas clear, EOM's intact, fundi    benign, both eyes  Ears:    Normal TM's and external ear canals, both ears  Nose:   Nares normal, septum midline, mucosa normal, no drainage    or sinus tenderness  Throat:   Lips, mucosa, and tongue normal; teeth and gums normal  Neck:   Supple, symmetrical, trachea midline, no adenopathy;    thyroid:  no enlargement/tenderness/nodules; no carotid   bruit or JVD  Back:     Symmetric, no curvature, ROM normal, no CVA tenderness  Lungs:     Clear to auscultation bilaterally, respirations unlabored  Chest Wall:    No tenderness or deformity   Heart:    Regular rate and rhythm, S1 and S2 normal, no murmur, rub   or gallop  Breast Exam:    No tenderness, masses, or nipple abnormality  Abdomen:     Soft, non-tender, bowel sounds active all four quadrants,    no masses, no organomegaly  Genitalia:    Pelvic: cervix normal in appearance, external genitalia normal, no adnexal masses or tenderness, no cervical motion tenderness, rectovaginal septum normal, uterus normal size, shape, and consistency and vagina normal without discharge  Extremities:   Extremities normal, atraumatic, no cyanosis or edema  Pulses:   2+ and symmetric all extremities  Skin:   Skin color, texture, turgor normal, no  rashes or lesions  Lymph nodes:   Cervical, supraclavicular, and axillary nodes normal  Neurologic:   CNII-XII intact, normal strength, sensation and reflexes    throughout     Assessment & Plan:   Problem List Items Addressed This Visit      Unprioritized   Hyperlipidemia LDL goal <130    Using the Framingham risk calculator,  her 10 year risk of coronary artery disease is 7%.  No therapy warranted based on recent guideline changes    Lab Results  Component Value Date   CHOL 236 (H) 08/26/2020   HDL 46.60 08/26/2020   LDLCALC 168 (H) 08/26/2020   LDLDIRECT 193.0 05/31/2016   TRIG 104.0 08/26/2020   CHOLHDL 5 08/26/2020         Relevant Orders   Lipid panel (Completed)   Comprehensive metabolic panel (Completed)   Visit for preventive health examination    age appropriate education and counseling updated, referrals for preventative services and immunizations addressed, dietary and smoking counseling addressed, most recent labs reviewed.  I have personally reviewed and have noted:  1) the patient's medical and social history 2)  The pt's use of alcohol, tobacco, and illicit drugs 3) The patient's current medications and supplements 4) Functional ability including ADL's, fall risk, home safety risk, hearing and visual impairment 5) Diet and physical activities 6) Evidence for depression or mood disorder 7) The patient's height, weight, and BMI have been recorded in the chart  I have made referrals, and provided counseling and education based on review of the above       Other Visit Diagnoses    Pap smear for cervical cancer screening    -  Primary   Relevant Orders   Cytology - PAP( [place]) (Completed)   Screening for HIV without presence of risk factors       Relevant Orders   HIV Antibody (routine testing w rflx) (Completed)      I have discontinued Aurther Loft. Ketelsen's predniSONE.  No orders of the defined types were placed in this  encounter.   Medications Discontinued During This Encounter  Medication Reason   predniSONE (DELTASONE) 10 MG tablet     Follow-up: No follow-ups on file.   Sherlene Shams, MD

## 2020-08-27 LAB — CYTOLOGY - PAP
Comment: NEGATIVE
Diagnosis: NEGATIVE
High risk HPV: NEGATIVE

## 2020-08-27 LAB — HIV ANTIBODY (ROUTINE TESTING W REFLEX): HIV 1&2 Ab, 4th Generation: NONREACTIVE

## 2020-08-28 NOTE — Assessment & Plan Note (Signed)

## 2020-08-28 NOTE — Progress Notes (Signed)
Your labs are normal.   I am pleased to inform you that your PAP smear was normal,  And your  HPV screen was negative.  We will repeat in 3 years    Dr Darrick Huntsman

## 2020-08-28 NOTE — Assessment & Plan Note (Addendum)
Using the Framingham risk calculator,  her 10 year risk of coronary artery disease is 7%.  No therapy warranted based on recent guideline changes    Lab Results  Component Value Date   CHOL 236 (H) 08/26/2020   HDL 46.60 08/26/2020   LDLCALC 168 (H) 08/26/2020   LDLDIRECT 193.0 05/31/2016   TRIG 104.0 08/26/2020   CHOLHDL 5 08/26/2020

## 2020-09-20 IMAGING — MG DIGITAL SCREENING BILAT W/ TOMO W/ CAD
8 series · 8 of 24 positions shown · non-contrast
Comparison: Previous exam(s).

CLINICAL DATA: Screening.

EXAM:
DIGITAL SCREENING BILATERAL MAMMOGRAM WITH TOMO AND CAD

[L CC synth-2D]
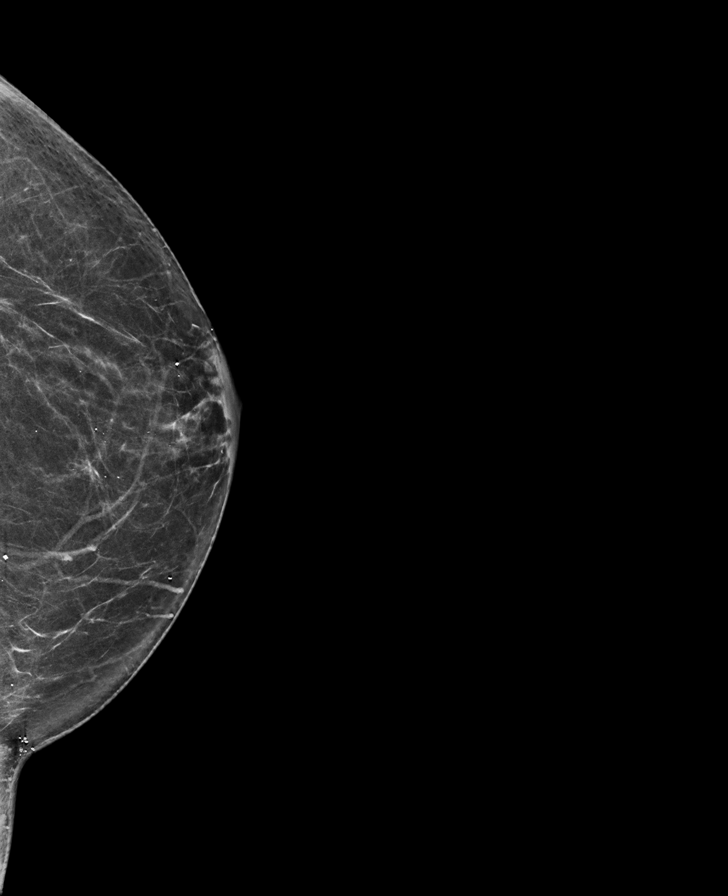

[R MLO synth-2D]
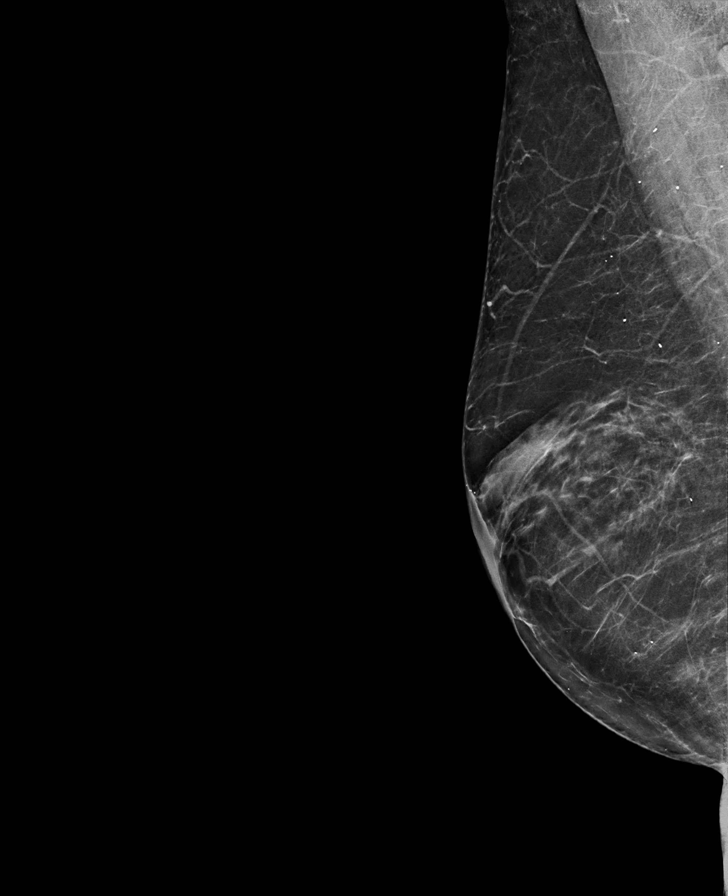

[R CC synth-2D]
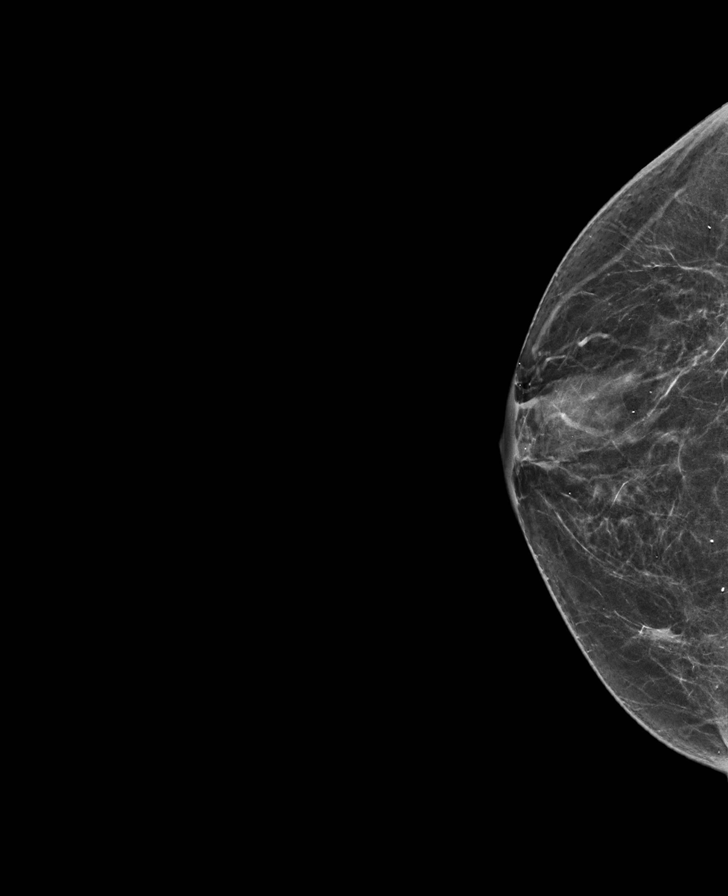

[L MLO synth-2D]
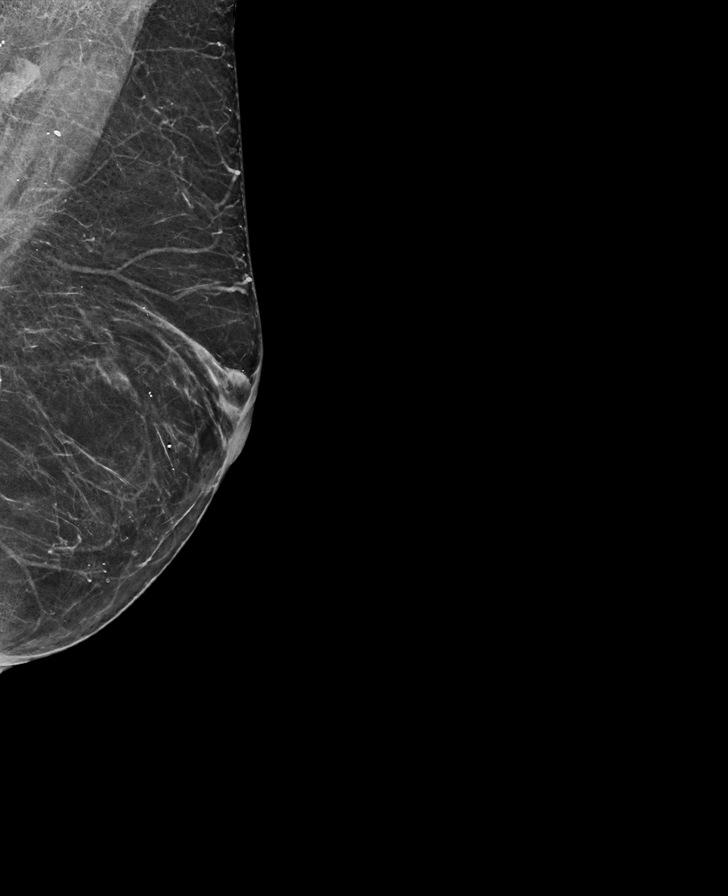

[R CC tomo · tomo slice 34/67.0]
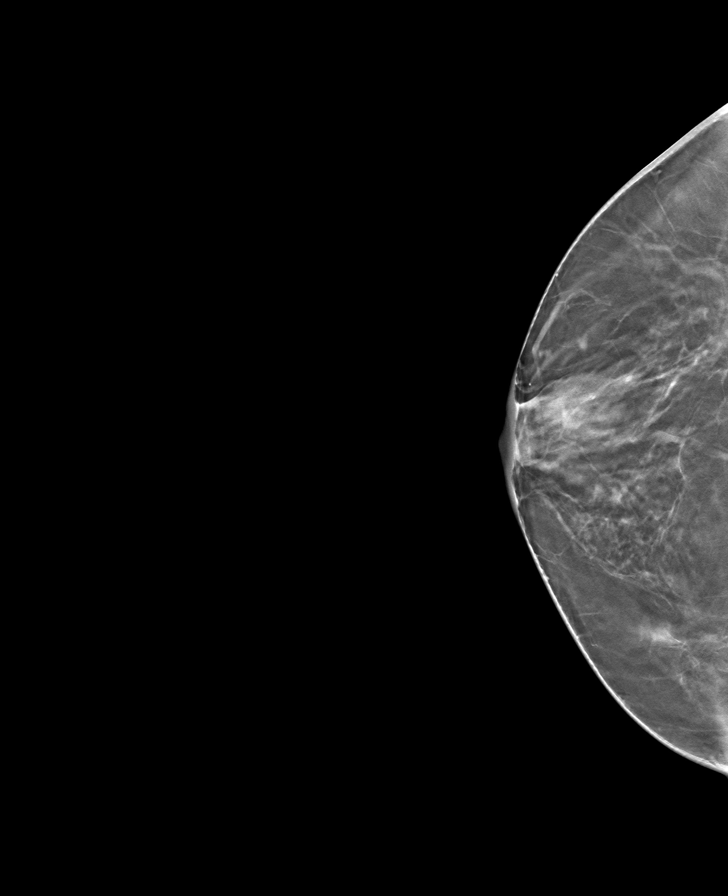

[R MLO tomo · tomo slice 35/70.0]
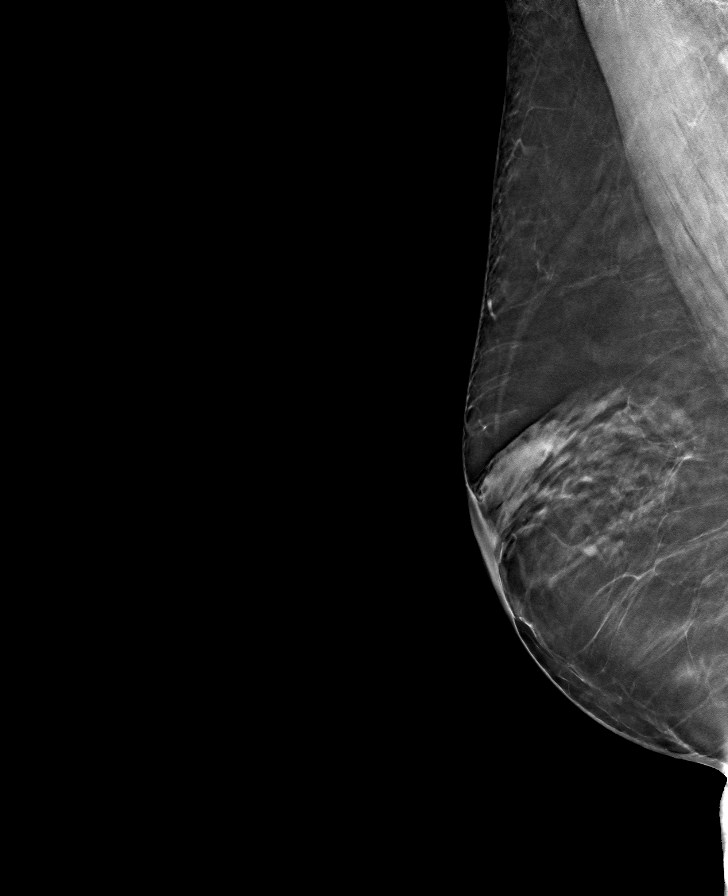

[L CC tomo · tomo slice 35/69.0]
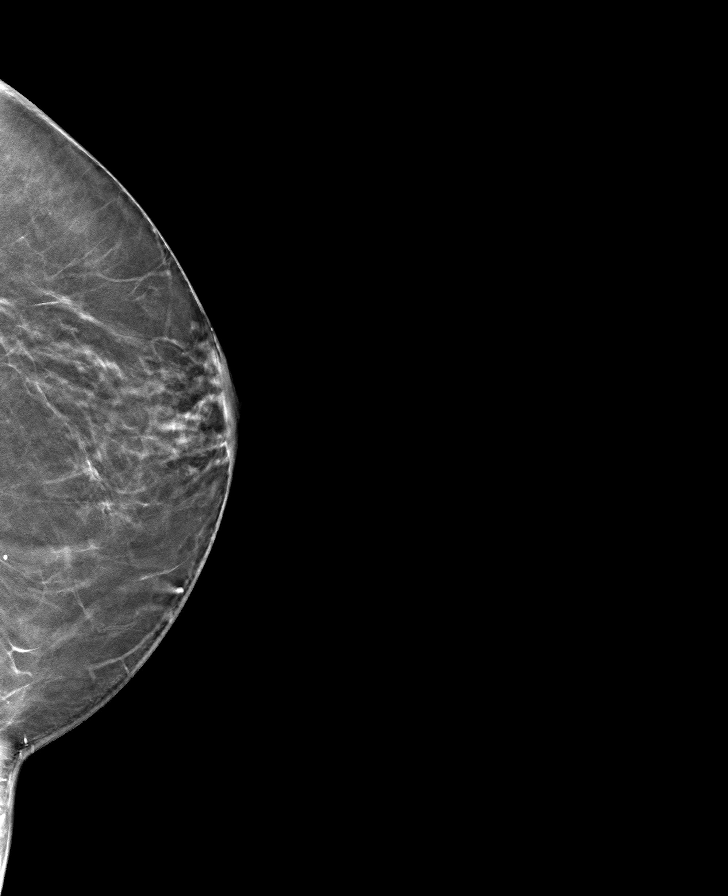

[L MLO tomo · tomo slice 33/65.0]
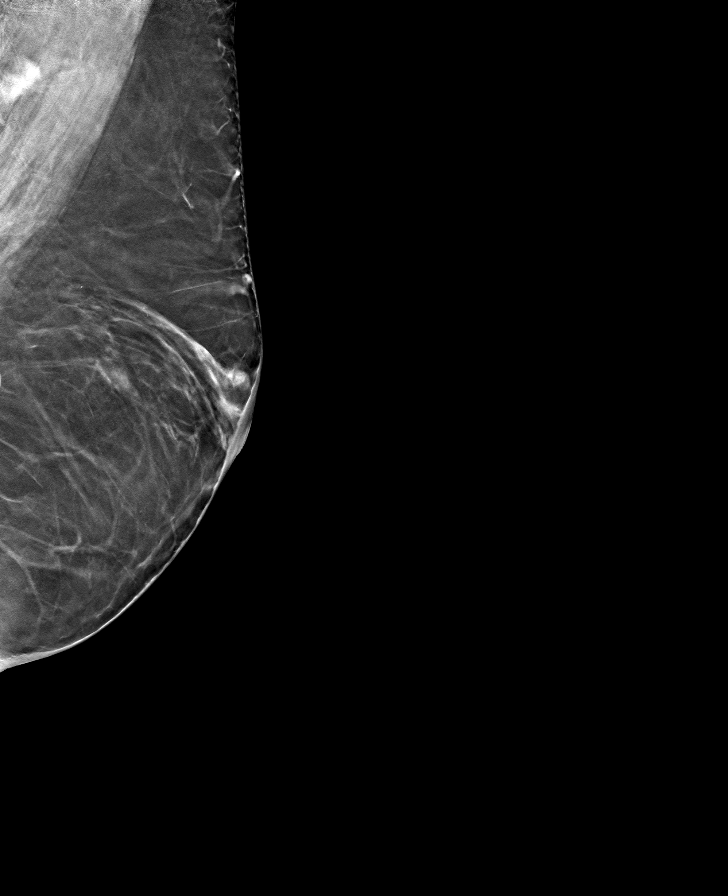

[8 of 24 positions shown; findings below may reference images not displayed]

ACR Breast Density Category b: There are scattered areas of
fibroglandular density.
FINDINGS: There are no findings suspicious for malignancy. Images were
processed with CAD.
IMPRESSION: No mammographic evidence of malignancy. A result letter of this
screening mammogram will be mailed directly to the patient.

RECOMMENDATION:
Screening mammogram in one year. (Code:[id number])

BI-RADS CATEGORY  1: Negative.

## 2020-09-22 DIAGNOSIS — G4733 Obstructive sleep apnea (adult) (pediatric): Secondary | ICD-10-CM | POA: Diagnosis not present

## 2021-05-09 DIAGNOSIS — H1031 Unspecified acute conjunctivitis, right eye: Secondary | ICD-10-CM | POA: Diagnosis not present

## 2021-08-25 DIAGNOSIS — G4733 Obstructive sleep apnea (adult) (pediatric): Secondary | ICD-10-CM | POA: Diagnosis not present

## 2021-08-29 ENCOUNTER — Other Ambulatory Visit: Payer: Self-pay

## 2021-08-29 ENCOUNTER — Ambulatory Visit (INDEPENDENT_AMBULATORY_CARE_PROVIDER_SITE_OTHER): Payer: BC Managed Care – PPO | Admitting: Internal Medicine

## 2021-08-29 ENCOUNTER — Encounter: Payer: Self-pay | Admitting: Internal Medicine

## 2021-08-29 VITALS — BP 128/82 | HR 83 | Temp 96.6°F | Ht 66.0 in | Wt 146.4 lb

## 2021-08-29 DIAGNOSIS — Z1231 Encounter for screening mammogram for malignant neoplasm of breast: Secondary | ICD-10-CM | POA: Diagnosis not present

## 2021-08-29 DIAGNOSIS — Z23 Encounter for immunization: Secondary | ICD-10-CM | POA: Diagnosis not present

## 2021-08-29 DIAGNOSIS — E559 Vitamin D deficiency, unspecified: Secondary | ICD-10-CM | POA: Diagnosis not present

## 2021-08-29 DIAGNOSIS — Z0000 Encounter for general adult medical examination without abnormal findings: Secondary | ICD-10-CM | POA: Diagnosis not present

## 2021-08-29 DIAGNOSIS — Z1211 Encounter for screening for malignant neoplasm of colon: Secondary | ICD-10-CM

## 2021-08-29 DIAGNOSIS — E785 Hyperlipidemia, unspecified: Secondary | ICD-10-CM | POA: Diagnosis not present

## 2021-08-29 LAB — COMPREHENSIVE METABOLIC PANEL
ALT: 14 U/L (ref 0–35)
AST: 16 U/L (ref 0–37)
Albumin: 4.3 g/dL (ref 3.5–5.2)
Alkaline Phosphatase: 74 U/L (ref 39–117)
BUN: 19 mg/dL (ref 6–23)
CO2: 26 mEq/L (ref 19–32)
Calcium: 9.1 mg/dL (ref 8.4–10.5)
Chloride: 105 mEq/L (ref 96–112)
Creatinine, Ser: 0.52 mg/dL (ref 0.40–1.20)
GFR: 100.02 mL/min (ref >60.00)
Glucose, Bld: 85 mg/dL (ref 70–99)
Potassium: 4 mEq/L (ref 3.5–5.1)
Sodium: 140 mEq/L (ref 135–145)
Total Bilirubin: 0.8 mg/dL (ref 0.2–1.2)
Total Protein: 6.7 g/dL (ref 6.0–8.3)

## 2021-08-29 LAB — LIPID PANEL
Cholesterol: 221 mg/dL — ABNORMAL HIGH (ref 0–200)
HDL: 49.3 mg/dL (ref >39.00)
LDL Cholesterol: 160 mg/dL — ABNORMAL HIGH (ref 0–99)
NonHDL: 172.06
Total CHOL/HDL Ratio: 4
Triglycerides: 59 mg/dL (ref 0.0–149.0)
VLDL: 11.8 mg/dL (ref 0.0–40.0)

## 2021-08-29 LAB — VITAMIN D 25 HYDROXY (VIT D DEFICIENCY, FRACTURES): VITD: 23.14 ng/mL — ABNORMAL LOW (ref 30.00–100.00)

## 2021-08-29 NOTE — Assessment & Plan Note (Signed)
Last colonoscopy reviewed  Hyperplastic polyps were retrieved on 2012 colonoscopy by Dr Mechele Collin.  Patient has been given the choice of screening with colonoscopy or Cologuard, as she is at average risk for colon CA and has chosen Cologuard. Test ordered

## 2021-08-29 NOTE — Assessment & Plan Note (Addendum)
Last several years of imaging studies reviewed . There is no compelling evidence of atherosclerosis to warrant statin therapy given  patient's current lipid panel and the AHA cardiac risk calculator projected risk of 4 %  Lab Results  Component Value Date   CHOL 221 (H) 08/29/2021   HDL 49.30 08/29/2021   LDLCALC 160 (H) 08/29/2021   LDLDIRECT 193.0 05/31/2016   TRIG 59.0 08/29/2021   CHOLHDL 4 08/29/2021

## 2021-08-29 NOTE — Progress Notes (Addendum)
Patient ID: Latasha Rios, female    DOB: [birth year]  Age: 60 y.o. MRN: [id number]  The patient is here for annual preventive examination and management of other chronic and acute problems.  This visit occurred during the SARS-CoV-2 public health emergency.  Safety protocols were in place, including screening questions prior to the visit, additional usage of staff PPE, and extensive cleaning of exam room while observing appropriate contact time as indicated for disinfecting solutions.     The risk factors are reflected in the social history.  The roster of all physicians providing medical care to patient - is listed in the Snapshot section of the chart.  Activities of daily living:  The patient is 100% independent in all ADLs: dressing, toileting, feeding as well as independent mobility  Home safety : The patient has smoke detectors in the home. They wear seatbelts.  There are no firearms at home. There is no violence in the home.   There is no risks for hepatitis, STDs or HIV. There is no   history of blood transfusion. They have no travel history to infectious disease endemic areas of the world.  The patient has seen their dentist in the last six month. They have seen their eye doctor in the last year. They admit to slight hearing difficulty with regard to whispered voices and some television programs.  They have deferred audiologic testing in the last year.  They do not  have excessive sun exposure. Discussed the need for sun protection: hats, long sleeves and use of sunscreen if there is significant sun exposure.   Diet: the importance of a healthy diet is discussed. They do have a healthy diet.  The benefits of regular aerobic exercise were discussed. She walks 4 times per week ,  20 minutes.   Depression screen: there are no signs or vegative symptoms of depression- irritability, change in appetite, anhedonia, sadness/tearfullness.  Cognitive assessment: the patient manages all  their financial and personal affairs and is actively engaged. They could relate day,date,year and events; recalled 2/3 objects at 3 minutes; performed clock-face test normally.  The following portions of the patient's history were reviewed and updated as appropriate: allergies, current medications, past family history, past medical history,  past surgical history, past social history  and problem list.  Visual acuity was not assessed per patient preference since she has regular follow up with her ophthalmologist. Hearing and body mass index were assessed and reviewed.   During the course of the visit the patient was educated and counseled about appropriate screening and preventive services including : fall prevention , diabetes screening, nutrition counseling, colorectal cancer screening, and recommended immunizations.    CC: The primary encounter diagnosis was Visit for preventive health examination. Diagnoses of Breast cancer screening by mammogram, Hyperlipidemia LDL goal <130, Vitamin D deficiency, COVID-19 vaccine administered, Colon cancer screening, Need for immunization against influenza, and Screening for colon cancer were also pertinent to this visit.  1) her disable brother in law passed away 2 weeks ago after recurrent aspiration pneumonias. She is relieved that he is no longer suffering.    History Jenniger has a past medical history of Tinea capitis (07/08/2017).   She has a past surgical history that includes Breast surgery (2011); Tonsillectomy and adenoidectomy (1965); Tubal ligation; and Reduction mammaplasty (Bilateral, 2014).   Her family history includes Breast cancer (age of onset: 35) in her sister; Breast cancer (age of onset: 85) in her mother; Cancer in her mother; Diabetes in her paternal  grandfather and paternal grandmother; Down syndrome in her brother; Heart disease (age of onset: 26) in her father; Hypertension in her brother, father, paternal grandfather, paternal  grandmother, and sister; Leukemia in her brother.She reports that she has never smoked. She has never used smokeless tobacco. She reports that she does not drink alcohol and does not use drugs.  No outpatient medications prior to visit.   No facility-administered medications prior to visit.    Review of Systems  Patient denies headache, fevers, malaise, unintentional weight loss, skin rash, eye pain, sinus congestion and sinus pain, sore throat, dysphagia,  hemoptysis , cough, dyspnea, wheezing, chest pain, palpitations, orthopnea, edema, abdominal pain, nausea, melena, diarrhea, constipation, flank pain, dysuria, hematuria, urinary  Frequency, nocturia, numbness, tingling, seizures,  Focal weakness, Loss of consciousness,  Tremor, insomnia, depression, anxiety, and suicidal ideation.     Objective:  BP 128/82 (BP Location: Left Arm, Patient Position: Sitting, Cuff Size: Normal)   Pulse 83   Temp (!) 96.6 F (35.9 C) (Temporal)   Ht 5\' 6"  (1.676 m)   Wt 146 lb 6.4 oz (66.4 kg)   LMP 12/28/2014 (Approximate)   SpO2 95%   BMI 23.63 kg/m   Physical Exam . General appearance: alert, cooperative and appears stated age Head: Normocephalic, without obvious abnormality, atraumatic Eyes: conjunctivae/corneas clear. PERRL, EOM's intact. Fundi benign. Ears: normal TM's and external ear canals both ears Nose: Nares normal. Septum midline. Mucosa normal. No drainage or sinus tenderness. Throat: lips, mucosa, and tongue normal; teeth and gums normal Neck: no adenopathy, no carotid bruit, no JVD, supple, symmetrical, trachea midline and thyroid not enlarged, symmetric, no tenderness/mass/nodules Lungs: clear to auscultation bilaterally Breasts: normal appearance, no masses or tenderness. Scars of bilateral breast reduction noted  Heart: regular rate and rhythm, S1, S2 normal, no murmur, click, rub or gallop Abdomen: soft, non-tender; bowel sounds normal; no masses,  no organomegaly Extremities:  extremities normal, atraumatic, no cyanosis or edema Pulses: 2+ and symmetric Skin: Skin color, texture, turgor normal. No rashes or lesions Neurologic: Alert and oriented X 3, normal strength and tone. Normal symmetric reflexes. Normal coordination and gait.     Assessment & Plan:   Problem List Items Addressed This Visit       Unprioritized   Hyperlipidemia LDL goal <130    Last several years of imaging studies reviewed . There is no compelling evidence of atherosclerosis to warrant statin therapy given  patient's current lipid panel and the AHA cardiac risk calculator projected risk of 4 %  Lab Results  Component Value Date   CHOL 221 (H) 08/29/2021   HDL 49.30 08/29/2021   LDLCALC 160 (H) 08/29/2021   LDLDIRECT 193.0 05/31/2016   TRIG 59.0 08/29/2021   CHOLHDL 4 08/29/2021         Relevant Orders   Lipid panel (Completed)   Comprehensive metabolic panel (Completed)   Screening for colon cancer    Last colonoscopy reviewed  Hyperplastic polyps were retrieved on 2012 colonoscopy by Dr 2013.  Patient has been given the choice of screening with colonoscopy or Cologuard, as she is at average risk for colon CA and has chosen Cologuard. Test ordered       Visit for preventive health examination - Primary    age appropriate education and counseling updated, referrals for preventative services and immunizations addressed, dietary and smoking counseling addressed, most recent labs reviewed.  I have personally reviewed and have noted:   1) the patient's medical and social history 2) The  pt's use of alcohol, tobacco, and illicit drugs 3) The patient's current medications and supplements 4) Functional ability including ADL's, fall risk, home safety risk, hearing and visual impairment 5) Diet and physical activities 6) Evidence for depression or mood disorder 7) The patient's height, weight, and BMI have been recorded in the chart   I have made referrals, and provided counseling  and education based on review of the above      Vitamin D deficiency    Advised to start taking 2000 Ius  Of D3 daily       Relevant Orders   VITAMIN D 25 Hydroxy (Vit-D Deficiency, Fractures) (Completed)   Other Visit Diagnoses     Breast cancer screening by mammogram       Relevant Orders   MM 3D SCREEN BREAST BILATERAL   COVID-19 vaccine administered       Relevant Orders   SARS-CoV-2 Semi-Quantitative Total Antibody, Spike   Colon cancer screening       Relevant Orders   Cologuard   Need for immunization against influenza       Relevant Orders   Flu Vaccine QUAD 61mo+IM (Fluarix, Fluzone & Alfiuria Quad PF) (Completed)       Aurther Loft. Tsui does not currently have medications on file.  No orders of the defined types were placed in this encounter.   There are no discontinued medications.  Follow-up: Return in about 1 year (around 08/29/2022).   Sherlene Shams, MD

## 2021-08-29 NOTE — Patient Instructions (Addendum)
I DO RECOMMEND THE SHINGRIX VACCINE. IT CAN BE OBTAINED HERE OR AT YOUR PHARMACY. It is a 2 shot series   Mammogram has been ordered    I will initiate the order for your colon cancer screening  Test, the one called  Cologuard.  It will be delivered to your house, and you will send off a stool sample in the envelope it provides.   Please Check with your insurance to make sure it is covered as a screening test

## 2021-08-29 NOTE — Assessment & Plan Note (Signed)

## 2021-08-30 NOTE — Assessment & Plan Note (Signed)
Advised to start taking 2000 Ius  Of D3 daily

## 2021-09-01 LAB — SARS-COV-2 SEMI-QUANTITATIVE TOTAL ANTIBODY, SPIKE: SARS COV2 AB, Total Spike Semi QN: >2500 U/mL — ABNORMAL HIGH (ref <0.8)

## 2021-09-21 DIAGNOSIS — Z1211 Encounter for screening for malignant neoplasm of colon: Secondary | ICD-10-CM | POA: Diagnosis not present

## 2021-09-24 DIAGNOSIS — G4733 Obstructive sleep apnea (adult) (pediatric): Secondary | ICD-10-CM | POA: Diagnosis not present

## 2021-09-29 LAB — COLOGUARD: COLOGUARD: NEGATIVE

## 2021-10-25 DIAGNOSIS — G4733 Obstructive sleep apnea (adult) (pediatric): Secondary | ICD-10-CM | POA: Diagnosis not present

## 2022-01-11 ENCOUNTER — Ambulatory Visit
Admission: RE | Admit: 2022-01-11 | Discharge: 2022-01-11 | Disposition: A | Discharge status: Home / Self Care | Payer: BC Managed Care – PPO | Source: Ambulatory Visit | Attending: Internal Medicine | Admitting: Internal Medicine

## 2022-01-11 ENCOUNTER — Other Ambulatory Visit: Payer: Self-pay

## 2022-01-11 DIAGNOSIS — Z1231 Encounter for screening mammogram for malignant neoplasm of breast: Secondary | ICD-10-CM | POA: Diagnosis not present

## 2022-04-25 DIAGNOSIS — M7542 Impingement syndrome of left shoulder: Secondary | ICD-10-CM | POA: Diagnosis not present

## 2022-07-25 DIAGNOSIS — G4733 Obstructive sleep apnea (adult) (pediatric): Secondary | ICD-10-CM | POA: Diagnosis not present

## 2022-08-25 DIAGNOSIS — G4733 Obstructive sleep apnea (adult) (pediatric): Secondary | ICD-10-CM | POA: Diagnosis not present

## 2022-08-29 ENCOUNTER — Encounter: Payer: BC Managed Care – PPO | Admitting: Internal Medicine

## 2022-08-30 ENCOUNTER — Encounter: Payer: Self-pay | Admitting: Internal Medicine

## 2022-08-30 ENCOUNTER — Ambulatory Visit (INDEPENDENT_AMBULATORY_CARE_PROVIDER_SITE_OTHER): Payer: BC Managed Care – PPO | Admitting: Internal Medicine

## 2022-08-30 VITALS — BP 110/68 | HR 87 | Temp 97.9°F | Ht 66.0 in | Wt 142.0 lb

## 2022-08-30 DIAGNOSIS — Z1231 Encounter for screening mammogram for malignant neoplasm of breast: Secondary | ICD-10-CM | POA: Diagnosis not present

## 2022-08-30 DIAGNOSIS — E559 Vitamin D deficiency, unspecified: Secondary | ICD-10-CM

## 2022-08-30 DIAGNOSIS — E785 Hyperlipidemia, unspecified: Secondary | ICD-10-CM | POA: Diagnosis not present

## 2022-08-30 DIAGNOSIS — S39012A Strain of muscle, fascia and tendon of lower back, initial encounter: Secondary | ICD-10-CM | POA: Diagnosis not present

## 2022-08-30 DIAGNOSIS — Z0001 Encounter for general adult medical examination with abnormal findings: Secondary | ICD-10-CM

## 2022-08-30 DIAGNOSIS — R5383 Other fatigue: Secondary | ICD-10-CM | POA: Diagnosis not present

## 2022-08-30 LAB — COMPREHENSIVE METABOLIC PANEL
ALT: 15 U/L (ref 0–35)
AST: 17 U/L (ref 0–37)
Albumin: 4.3 g/dL (ref 3.5–5.2)
Alkaline Phosphatase: 72 U/L (ref 39–117)
BUN: 14 mg/dL (ref 6–23)
CO2: 29 mEq/L (ref 19–32)
Calcium: 9.2 mg/dL (ref 8.4–10.5)
Chloride: 104 mEq/L (ref 96–112)
Creatinine, Ser: 0.53 mg/dL (ref 0.40–1.20)
GFR: 98.86 mL/min (ref >60.00)
Glucose, Bld: 95 mg/dL (ref 70–99)
Potassium: 4 mEq/L (ref 3.5–5.1)
Sodium: 140 mEq/L (ref 135–145)
Total Bilirubin: 0.6 mg/dL (ref 0.2–1.2)
Total Protein: 7 g/dL (ref 6.0–8.3)

## 2022-08-30 LAB — VITAMIN D 25 HYDROXY (VIT D DEFICIENCY, FRACTURES): VITD: 25.44 ng/mL — ABNORMAL LOW (ref 30.00–100.00)

## 2022-08-30 LAB — LIPID PANEL
Cholesterol: 238 mg/dL — ABNORMAL HIGH (ref 0–200)
HDL: 52.9 mg/dL (ref >39.00)
LDL Cholesterol: 170 mg/dL — ABNORMAL HIGH (ref 0–99)
NonHDL: 185.26
Total CHOL/HDL Ratio: 5
Triglycerides: 75 mg/dL (ref 0.0–149.0)
VLDL: 15 mg/dL (ref 0.0–40.0)

## 2022-08-30 LAB — TSH: TSH: 0.67 u[IU]/mL (ref 0.35–5.50)

## 2022-08-30 MED ORDER — TIZANIDINE HCL 4 MG PO TABS: 4.0000 mg | ORAL_TABLET | Freq: Four times a day (QID) | ORAL | 0 refills | Status: DC | PRN

## 2022-08-30 MED ORDER — ZOSTER VAC RECOMB ADJUVANTED 50 MCG/0.5ML IM SUSR: 0.5000 mL | Freq: Once | INTRAMUSCULAR | 1 refills | Status: AC

## 2022-08-30 NOTE — Progress Notes (Unsigned)
The patient is here for annual preventive examination and management of other chronic and acute problems.   The risk factors are reflected in the social history.   The roster of all physicians providing medical care to patient - is listed in the Snapshot section of the chart.   Activities of daily living:  The patient is 100% independent in all ADLs: dressing, toileting, feeding as well as independent mobility   Home safety : The patient has smoke detectors in the home. They wear seatbelts.  There are no unsecured firearms at home. There is no violence in the home.    There is no risks for hepatitis, STDs or HIV. There is no   history of blood transfusion. They have no travel history to infectious disease endemic areas of the world.   The patient has seen their dentist in the last six month. They have seen their eye doctor in the last year. The patinet  denies slight hearing difficulty with regard to whispered voices and some television programs.  They have deferred audiologic testing in the last year.  They do not  have excessive sun exposure. Discussed the need for sun protection: hats, long sleeves and use of sunscreen if there is significant sun exposure.    Diet: the importance of a healthy diet is discussed. They do have a healthy diet.   The benefits of regular aerobic exercise were discussed. The patient  exercises  3 to 5 days per week  for  60 minutes.    Depression screen: there are no signs or vegative symptoms of depression- irritability, change in appetite, anhedonia, sadness/tearfullness.   The following portions of the patient's history were reviewed and updated as appropriate: allergies, current medications, past family history, past medical history,  past surgical history, past social history  and problem list.   Visual acuity was not assessed per patient preference since the patient has regular follow up with an  ophthalmologist. Hearing and body mass index were assessed and  reviewed.    During the course of the visit the patient was educated and counseled about appropriate screening and preventive services including : fall prevention , diabetes screening, nutrition counseling, colorectal cancer screening, and recommended immunizations.    Chief Complaint:  Back pain : started yesterday after lifting removing a suitcase off the carousel.  Started several hours later,  mid back     Review of Symptoms  Patient denies headache, fevers, malaise, unintentional weight loss, skin rash, eye pain, sinus congestion and sinus pain, sore throat, dysphagia,  hemoptysis , cough, dyspnea, wheezing, chest pain, palpitations, orthopnea, edema, abdominal pain, nausea, melena, diarrhea, constipation, flank pain, dysuria, hematuria, urinary  Frequency, nocturia, numbness, tingling, seizures,  Focal weakness, Loss of consciousness,  Tremor, insomnia, depression, anxiety, and suicidal ideation.    Physical Exam:  BP 110/68 (BP Location: Left Arm, Patient Position: Sitting, Cuff Size: Normal)   Pulse 87   Temp 97.9 F (36.6 C) (Oral)   Ht 5\' 6"  (1.676 m)   Wt 142 lb (64.4 kg)   LMP 12/28/2014 (Approximate)   SpO2 94%   BMI 22.92 kg/m    General appearance: alert, cooperative and appears stated age Head: Normocephalic, without obvious abnormality, atraumatic Eyes: conjunctivae/corneas clear. PERRL, EOM's intact. Fundi benign. Ears: normal TM's and external ear canals both ears Nose: Nares normal. Septum midline. Mucosa normal. No drainage or sinus tenderness. Throat: lips, mucosa, and tongue normal; teeth and gums normal Neck: no adenopathy, no carotid bruit, no JVD, supple,  symmetrical, trachea midline and thyroid not enlarged, symmetric, no tenderness/mass/nodules Lungs: clear to auscultation bilaterally Breasts: normal appearance, no masses or tenderness Heart: regular rate and rhythm, S1, S2 normal, no murmur, click, rub or gallop Abdomen: soft, non-tender; bowel sounds  normal; no masses,  no organomegaly Extremities: extremities normal, atraumatic, no cyanosis or edema Back:  tender paraspinus muscles  with spasm noted.  Norma ROM   negative straight leg lift  Pulses: 2+ and symmetric Skin: Skin color, texture, turgor normal. No rashes or lesions Neurologic: Alert and oriented X 3, normal strength and tone. Normal symmetric reflexes. Normal coordination and gait.     Assessment and Plan:  Strain of muscle, fascia and tendon of lower back, initial encounter WITH NON RADIATING SYMPTOMS , occurred after removing a suitcase from [place] luggage [place]ousel at the airport yesterday.  Rest stretch,  Heat ice,  NSIDS tylenoxerl and muscle relaxer  Encounter for general adult medical examination with abnormal findings age appropriate education and counseling updated, referrals for preventative services and immunizations addressed, dietary and smoking counseling addressed, most recent labs reviewed.  I have personally reviewed and have noted:   1) the patient's medical and social history 2) The pt's use of alcohol, tobacco, and illicit drugs 3) The patient's current medications and supplements 4) Functional ability including ADL's, fall risk, home safety risk, hearing and visual impairment 5) Diet and physical activities 6) Evidence for depression or mood disorder 7) The patient's height, weight, and BMI have been recorded in the chart   I have made referrals, and provided counseling and education based on review of the above  Vitamin D deficiency Persistently low.  Advised to start taking 2000 Ius  Of D3 daily   Moderate mixed hyperlipidemia not requiring statin therapy 10 yr risk of CAD using the AHA risk calculator is 3.5%.  Patient has no incidental evidence of atherosclerosis .  No treatment advised at this time   Lab Results  Component Value Date   CHOL 238 (H) 08/30/2022   HDL 52.90 08/30/2022   LDLCALC 170 (H) 08/30/2022   LDLDIRECT 193.0 05/31/2016    TRIG 75.0 08/30/2022   CHOLHDL 5 08/30/2022     Updated Medication List Outpatient Encounter Medications as of 08/30/2022  Medication Sig   tiZANidine (ZANAFLEX) 4 MG tablet Take 1 tablet (4 mg total) by mouth every 6 (six) hours as needed for muscle spasms.   [EXPIRED] Zoster Vaccine Adjuvanted [facility]) injection Inject 0.5 mLs into the muscle once for 1 dose.   No facility-administered encounter medications on file as of 08/30/2022.

## 2022-08-30 NOTE — Patient Instructions (Addendum)
Your strained your back muscles!    You can take up to  800 mg of advil  PLUS  1000 mg of acetominophen (tylenol) TWICE DAILY UNTIL BETTER    ADDING A  MUSCLE RELAXER .  EVERY 6 HOURS  NO GOLF UNTIL  SATURDAY  STRETCH,  HEAT , ICE

## 2022-08-30 NOTE — Assessment & Plan Note (Signed)
WITH NON RADIATING SYMPTOMS , occurred after removing a suitcase from a [place] at the airport yesterday.  Rest stretch,  Heat ice,  NSIDS tylenoxerl and muscle relaxer

## 2022-08-31 NOTE — Assessment & Plan Note (Signed)
10 yr risk of CAD using the AHA risk calculator is 3.5%.  Patient has no incidental evidence of atherosclerosis .  No treatment advised at this time   Lab Results  Component Value Date   CHOL 238 (H) 08/30/2022   HDL 52.90 08/30/2022   LDLCALC 170 (H) 08/30/2022   LDLDIRECT 193.0 05/31/2016   TRIG 75.0 08/30/2022   CHOLHDL 5 08/30/2022

## 2022-08-31 NOTE — Assessment & Plan Note (Signed)
Persistently low.  Advised to start taking 2000 Ius  Of D3 daily

## 2022-08-31 NOTE — Assessment & Plan Note (Signed)

## 2022-09-05 DIAGNOSIS — G8929 Other chronic pain: Secondary | ICD-10-CM | POA: Diagnosis not present

## 2022-09-05 DIAGNOSIS — M48062 Spinal stenosis, lumbar region with neurogenic claudication: Secondary | ICD-10-CM | POA: Diagnosis not present

## 2022-09-24 DIAGNOSIS — G4733 Obstructive sleep apnea (adult) (pediatric): Secondary | ICD-10-CM | POA: Diagnosis not present

## 2022-10-11 DIAGNOSIS — M7591 Shoulder lesion, unspecified, right shoulder: Secondary | ICD-10-CM | POA: Diagnosis not present

## 2022-10-11 DIAGNOSIS — M25511 Pain in right shoulder: Secondary | ICD-10-CM | POA: Diagnosis not present

## 2022-11-06 DIAGNOSIS — M7591 Shoulder lesion, unspecified, right shoulder: Secondary | ICD-10-CM | POA: Diagnosis not present

## 2022-11-06 DIAGNOSIS — M7541 Impingement syndrome of right shoulder: Secondary | ICD-10-CM | POA: Diagnosis not present

## 2022-12-21 DIAGNOSIS — G4733 Obstructive sleep apnea (adult) (pediatric): Secondary | ICD-10-CM | POA: Diagnosis not present

## 2023-01-21 DIAGNOSIS — G4733 Obstructive sleep apnea (adult) (pediatric): Secondary | ICD-10-CM | POA: Diagnosis not present

## 2023-01-22 ENCOUNTER — Ambulatory Visit
Admission: RE | Admit: 2023-01-22 | Discharge: 2023-01-22 | Disposition: A | Discharge status: Home / Self Care | Payer: BC Managed Care – PPO | Source: Ambulatory Visit | Attending: Internal Medicine | Admitting: Internal Medicine

## 2023-01-22 DIAGNOSIS — Z1231 Encounter for screening mammogram for malignant neoplasm of breast: Secondary | ICD-10-CM | POA: Diagnosis not present

## 2023-02-19 DIAGNOSIS — G4733 Obstructive sleep apnea (adult) (pediatric): Secondary | ICD-10-CM | POA: Diagnosis not present

## 2023-05-14 DIAGNOSIS — G4733 Obstructive sleep apnea (adult) (pediatric): Secondary | ICD-10-CM | POA: Diagnosis not present

## 2023-06-13 DIAGNOSIS — G4733 Obstructive sleep apnea (adult) (pediatric): Secondary | ICD-10-CM | POA: Diagnosis not present

## 2023-07-14 DIAGNOSIS — G4733 Obstructive sleep apnea (adult) (pediatric): Secondary | ICD-10-CM | POA: Diagnosis not present

## 2023-09-05 ENCOUNTER — Other Ambulatory Visit (HOSPITAL_COMMUNITY)
Admission: RE | Admit: 2023-09-05 | Discharge: 2023-09-05 | Disposition: A | Discharge status: Home / Self Care | Payer: BLUE CROSS/BLUE SHIELD | Source: Ambulatory Visit | Attending: Internal Medicine | Admitting: Internal Medicine

## 2023-09-05 ENCOUNTER — Encounter: Payer: Self-pay | Admitting: Internal Medicine

## 2023-09-05 ENCOUNTER — Ambulatory Visit (INDEPENDENT_AMBULATORY_CARE_PROVIDER_SITE_OTHER): Payer: BLUE CROSS/BLUE SHIELD | Admitting: Internal Medicine

## 2023-09-05 VITALS — BP 130/70 | HR 87 | Ht 66.0 in | Wt 143.8 lb

## 2023-09-05 DIAGNOSIS — E782 Mixed hyperlipidemia: Secondary | ICD-10-CM

## 2023-09-05 DIAGNOSIS — Z124 Encounter for screening for malignant neoplasm of cervix: Secondary | ICD-10-CM | POA: Insufficient documentation

## 2023-09-05 DIAGNOSIS — R5383 Other fatigue: Secondary | ICD-10-CM

## 2023-09-05 DIAGNOSIS — Z1211 Encounter for screening for malignant neoplasm of colon: Secondary | ICD-10-CM | POA: Diagnosis not present

## 2023-09-05 DIAGNOSIS — Z1231 Encounter for screening mammogram for malignant neoplasm of breast: Secondary | ICD-10-CM

## 2023-09-05 DIAGNOSIS — Z23 Encounter for immunization: Secondary | ICD-10-CM | POA: Diagnosis not present

## 2023-09-05 DIAGNOSIS — Z860100 Personal history of colon polyps, unspecified: Secondary | ICD-10-CM

## 2023-09-05 DIAGNOSIS — E559 Vitamin D deficiency, unspecified: Secondary | ICD-10-CM

## 2023-09-05 DIAGNOSIS — Z0001 Encounter for general adult medical examination with abnormal findings: Secondary | ICD-10-CM

## 2023-09-05 DIAGNOSIS — G4733 Obstructive sleep apnea (adult) (pediatric): Secondary | ICD-10-CM

## 2023-09-05 NOTE — Assessment & Plan Note (Signed)
Moderately severe,  With desats to 76%  .  CPAP setting optimal at 5 cm H20 with resolution of desats.  (ordered by Erline Hauhap mcQueen 2017). She is wearing her CPAP every night a minimum of 6 hours per night and notes improved daytime wakefulness and decreased fatigue

## 2023-09-05 NOTE — Addendum Note (Signed)
Addended by: Sandy Salaam on: 09/05/2023 11:24 AM   Modules accepted: Orders

## 2023-09-05 NOTE — Assessment & Plan Note (Signed)
Hyperplastic,  she prefers to continue screening with Cologuard which was done in 2022 and negative

## 2023-09-05 NOTE — Assessment & Plan Note (Signed)
Recurrent/chronic  continue annula surveillance and supplementation

## 2023-09-05 NOTE — Patient Instructions (Signed)
Your annual mammogram has been ordered AND IS DUE in LATE  Latasha Rios will not allow [place] to schedule it for you,  so please  call to make your appointment [phone]  Please schedule a lab appt  at your leisure to have your cholesterol etc checked

## 2023-09-05 NOTE — Progress Notes (Signed)
Patient ID: SIOBAHN WORSLEY, female    DOB: 1961-11-08  Age: 62 y.o. MRN: [id number]  The patient is here for annual preventive examination and management of other chronic\ problems.   The risk factors are reflected in the social history.   The roster of all physicians providing medical care to patient - is listed in the Snapshot section of the chart.   Activities of daily living:  The patient is 100% independent in all ADLs: dressing, toileting, feeding as well as independent mobility   Home safety : The patient has smoke detectors in the home. They wear seatbelts.  There are no unsecured firearms at home. There is no violence in the home.    There is no risks for hepatitis, STDs or HIV. There is no   history of blood transfusion. They have no travel history to infectious disease endemic areas of the world.   The patient has seen their dentist in the last six month. They have seen their eye doctor in the last year. The patinet  denies slight hearing difficulty with regard to whispered voices and some television programs.  They have deferred audiologic testing in the last year.  They do not  have excessive sun exposure. Discussed the need for sun protection: hats, long sleeves and use of sunscreen if there is significant sun exposure.    Diet: the importance of a healthy diet is discussed. They do have a healthy diet.   The benefits of regular aerobic exercise were discussed. The patient  exercises  3 to 5 days per week  for  60 minutes.    Depression screen: there are no signs or vegative symptoms of depression- irritability, change in appetite, anhedonia, sadness/tearfullness.   The following portions of the patient's history were reviewed and updated as appropriate: allergies, current medications, past family history, past medical history,  past surgical history, past social history  and problem list.   Visual acuity was not assessed per patient preference since the patient has regular  follow up with an  ophthalmologist. Hearing and body mass index were assessed and reviewed.    During the course of the visit the patient was educated and counseled about appropriate screening and preventive services including : fall prevention , diabetes screening, nutrition counseling, colorectal cancer screening, and recommended immunizations.    Chief Complaint:  Some PND after spending time with grandkids  no fevers,  sinus pain or cough   Review of Symptoms  Patient denies headache, fevers, malaise, unintentional weight loss, skin rash, eye pain, sinus congestion and sinus pain, sore throat, dysphagia,  hemoptysis , cough, dyspnea, wheezing, chest pain, palpitations, orthopnea, edema, abdominal pain, nausea, melena, diarrhea, constipation, flank pain, dysuria, hematuria, urinary  Frequency, nocturia, numbness, tingling, seizures,  Focal weakness, Loss of consciousness,  Tremor, insomnia, depression, anxiety, and suicidal ideation.    Physical Exam:  BP 130/70   Pulse 87   Ht 5\' 6"  (1.676 m)   Wt 143 lb 12.8 oz (65.2 kg)   LMP 12/28/2014 (Approximate)   SpO2 95%   BMI 23.21 kg/m    Physical Exam Vitals reviewed.  Constitutional:      General: She is not in acute distress.    Appearance: Normal appearance. She is well-developed and normal weight. She is not ill-appearing, toxic-appearing or diaphoretic.  HENT:     Head: Normocephalic.     Right Ear: Tympanic membrane, ear canal and external ear normal. There is no impacted cerumen.     Left Ear:  Tympanic membrane, ear canal and external ear normal. There is no impacted cerumen.     Nose: Nose normal.     Mouth/Throat:     Mouth: Mucous membranes are moist.     Pharynx: Oropharynx is clear.  Eyes:     General: No scleral icterus.       Right eye: No discharge.        Left eye: No discharge.     Conjunctiva/sclera: Conjunctivae normal.     Pupils: Pupils are equal, round, and reactive to light.  Neck:     Thyroid: No  thyromegaly.     Vascular: No carotid bruit or JVD.  Cardiovascular:     Rate and Rhythm: Normal rate and regular rhythm.     Heart sounds: Normal heart sounds.  Pulmonary:     Effort: Pulmonary effort is normal. No respiratory distress.     Breath sounds: Normal breath sounds.  Chest:  Breasts:    Breasts are symmetrical.     Right: Normal. No swelling, inverted nipple, mass, nipple discharge, skin change or tenderness.     Left: Normal. No swelling, inverted nipple, mass, nipple discharge, skin change or tenderness.  Abdominal:     General: Bowel sounds are normal.     Palpations: Abdomen is soft. There is no mass.     Tenderness: There is no abdominal tenderness. There is no guarding or rebound.     Hernia: There is no hernia in the left inguinal area or right inguinal area.  Genitourinary:    Exam position: Lithotomy position.     Pubic Area: No rash or pubic lice.      Labia:        Right: No rash, tenderness, lesion or injury.        Left: No rash, tenderness, lesion or injury.      Vagina: Normal.     Cervix: Normal.     Uterus: Normal.      Adnexa: Right adnexa normal and left adnexa normal.  Musculoskeletal:        General: Normal range of motion.     Cervical back: Normal range of motion and neck supple.  Lymphadenopathy:     Cervical: No cervical adenopathy.     Upper Body:     Right upper body: No supraclavicular, axillary or pectoral adenopathy.     Left upper body: No supraclavicular, axillary or pectoral adenopathy.     Lower Body: No right inguinal adenopathy. No left inguinal adenopathy.  Skin:    General: Skin is warm and dry.  Neurological:     General: No focal deficit present.     Mental Status: She is alert and oriented to person, place, and time. Mental status is at baseline.  Psychiatric:        Mood and Affect: Mood normal.        Behavior: Behavior normal.        Thought Content: Thought content normal.        Judgment: Judgment normal.     Assessment and Plan: Encounter for screening mammogram for malignant neoplasm of breast -     3D Screening Mammogram, Left and Right; Future  Colon cancer screening  Moderate mixed hyperlipidemia not requiring statin therapy -     Lipid panel; Future -     LDL cholesterol, direct; Future -     Comprehensive metabolic panel; Future -     Hemoglobin A1c; Future  Other fatigue -  TSH; Future -     CBC with Differential/Platelet; Future  Cervical cancer screening -     Cytology - PAP  Encounter for general adult medical examination with abnormal findings Assessment & Plan: age appropriate education and counseling updated, referrals for preventative services and immunizations addressed, dietary and smoking counseling addressed, most recent labs reviewed.  I have personally reviewed and have noted:   1) the patient's medical and social history 2) The pt's use of alcohol, tobacco, and illicit drugs 3) The patient's current medications and supplements 4) Functional ability including ADL's, fall risk, home safety risk, hearing and visual impairment 5) Diet and physical activities 6) Evidence for depression or mood disorder 7) The patient's height, weight, and BMI have been recorded in the chart   I have made referrals, and provided counseling and education based on review of the above    History of colonic polyps Assessment & Plan: Hyperplastic,  she prefers to continue screening with Cologuard which was done in 2022 and negative    OSA (obstructive sleep apnea) Assessment & Plan: Moderately severe,  With desats to 76%  .  CPAP setting optimal at 5 cm H20 with resolution of desats.  (ordered by Erline Hau 2017). She is wearing her CPAP every night a minimum of 6 hours per night and notes improved daytime wakefulness and decreased fatigue      No follow-ups on file.  Sherlene Shams, MD

## 2023-09-05 NOTE — Assessment & Plan Note (Signed)

## 2023-09-07 LAB — CYTOLOGY - PAP
Comment: NEGATIVE
Diagnosis: NEGATIVE
High risk HPV: NEGATIVE

## 2023-09-13 ENCOUNTER — Other Ambulatory Visit: Payer: BLUE CROSS/BLUE SHIELD

## 2023-09-17 ENCOUNTER — Other Ambulatory Visit: Payer: BLUE CROSS/BLUE SHIELD

## 2023-09-17 DIAGNOSIS — R5383 Other fatigue: Secondary | ICD-10-CM | POA: Diagnosis not present

## 2023-09-17 DIAGNOSIS — E782 Mixed hyperlipidemia: Secondary | ICD-10-CM

## 2023-09-17 LAB — CBC WITH DIFFERENTIAL/PLATELET
Basophils Absolute: 0 10*3/uL (ref 0.0–0.1)
Basophils Relative: 0.8 % (ref 0.0–3.0)
Eosinophils Absolute: 0.3 10*3/uL (ref 0.0–0.7)
Eosinophils Relative: 4.3 % (ref 0.0–5.0)
HCT: 40.8 % (ref 36.0–46.0)
Hemoglobin: 13.4 g/dL (ref 12.0–15.0)
Lymphocytes Relative: 23.8 % (ref 12.0–46.0)
Lymphs Abs: 1.4 10*3/uL (ref 0.7–4.0)
MCHC: 32.9 g/dL (ref 30.0–36.0)
MCV: 94 fL (ref 78.0–100.0)
Monocytes Absolute: 0.7 10*3/uL (ref 0.1–1.0)
Monocytes Relative: 11.2 % (ref 3.0–12.0)
Neutro Abs: 3.6 10*3/uL (ref 1.4–7.7)
Neutrophils Relative %: 59.9 % (ref 43.0–77.0)
Platelets: 342 10*3/uL (ref 150.0–400.0)
RBC: 4.34 Mil/uL (ref 3.87–5.11)
RDW: 12.7 % (ref 11.5–15.5)
WBC: 6.1 10*3/uL (ref 4.0–10.5)

## 2023-09-17 LAB — LIPID PANEL
Cholesterol: 240 mg/dL — ABNORMAL HIGH (ref 0–200)
HDL: 52 mg/dL (ref >39.00)
LDL Cholesterol: 172 mg/dL — ABNORMAL HIGH (ref 0–99)
NonHDL: 187.83
Total CHOL/HDL Ratio: 5
Triglycerides: 80 mg/dL (ref 0.0–149.0)
VLDL: 16 mg/dL (ref 0.0–40.0)

## 2023-09-17 LAB — COMPREHENSIVE METABOLIC PANEL
ALT: 15 U/L (ref 0–35)
AST: 19 U/L (ref 0–37)
Albumin: 4.3 g/dL (ref 3.5–5.2)
Alkaline Phosphatase: 70 U/L (ref 39–117)
BUN: 17 mg/dL (ref 6–23)
CO2: 30 meq/L (ref 19–32)
Calcium: 9.4 mg/dL (ref 8.4–10.5)
Chloride: 103 meq/L (ref 96–112)
Creatinine, Ser: 0.62 mg/dL (ref 0.40–1.20)
GFR: 94.49 mL/min (ref >60.00)
Glucose, Bld: 91 mg/dL (ref 70–99)
Potassium: 4.5 meq/L (ref 3.5–5.1)
Sodium: 141 meq/L (ref 135–145)
Total Bilirubin: 0.7 mg/dL (ref 0.2–1.2)
Total Protein: 7.1 g/dL (ref 6.0–8.3)

## 2023-09-17 LAB — LDL CHOLESTEROL, DIRECT: Direct LDL: 185 mg/dL

## 2023-09-17 LAB — TSH: TSH: 0.47 u[IU]/mL (ref 0.35–5.50)

## 2023-09-17 LAB — HEMOGLOBIN A1C: Hgb A1c MFr Bld: 5.8 % (ref 4.6–6.5)

## 2023-09-18 NOTE — Assessment & Plan Note (Signed)
10 yr risk of CAD using the AHA risk calculator is 5.5%.  Patient has no incidental evidence of atherosclerosis .  No treatment advised at this time   Lab Results  Component Value Date   CHOL 240 (H) 09/17/2023   HDL 52.00 09/17/2023   LDLCALC 172 (H) 09/17/2023   LDLDIRECT 185.0 09/17/2023   TRIG 80.0 09/17/2023   CHOLHDL 5 09/17/2023

## 2024-01-14 ENCOUNTER — Ambulatory Visit (INDEPENDENT_AMBULATORY_CARE_PROVIDER_SITE_OTHER): Payer: BLUE CROSS/BLUE SHIELD | Admitting: Family Medicine

## 2024-01-14 ENCOUNTER — Encounter: Payer: Self-pay | Admitting: Family Medicine

## 2024-01-14 ENCOUNTER — Ambulatory Visit: Payer: Self-pay | Admitting: Internal Medicine

## 2024-01-14 VITALS — BP 138/78 | HR 100 | Temp 98.7°F | Resp 16 | Ht 66.0 in | Wt 143.4 lb

## 2024-01-14 DIAGNOSIS — J069 Acute upper respiratory infection, unspecified: Secondary | ICD-10-CM | POA: Insufficient documentation

## 2024-01-14 DIAGNOSIS — R051 Acute cough: Secondary | ICD-10-CM

## 2024-01-14 MED ORDER — BENZONATATE 200 MG PO CAPS: 200.0000 mg | ORAL_CAPSULE | Freq: Two times a day (BID) | ORAL | 0 refills | Status: DC | PRN

## 2024-01-14 NOTE — Telephone Encounter (Signed)
  Chief Complaint: cough Symptoms: cough, fatigue, runny nose Frequency: 4-5 days Pertinent Negatives: Patient denies SOB, CP, Fever, NVD Disposition: [] ED /[] Urgent Care (no appt availability in office) / [x] Appointment(In office/virtual)/ []  [facility]/ [] Home Care/ [] Refused Recommended Disposition /[] [facility]/ []  Follow-up with PCP Additional Notes: Patient calls reporting worsening cough x 4-5 days. States husband was diagnosed with HMPV and feels she is having similar symptoms. Per protocol, patient to be evaluated within 24 hours. First available appointment with PCP 01/28/24. Patient scheduled with first available provider in clinic for today at 1140. Care advice reviewed, patient verbalized understandingand denies further questions at this time. Alerting PCP for review.    Reason for Disposition  [1] Continuous (nonstop) coughing interferes with work or school AND [2] no improvement using cough treatment per Care Advice  Answer Assessment - Initial Assessment Questions 1. ONSET: "When did the cough begin?"      4-5 days 2. SEVERITY: "How bad is the cough today?"      Fairly constant 3. SPUTUM: "Describe the color of your sputum" (none, dry cough; clear, white, yellow, green)     Denies 4. HEMOPTYSIS: "Are you coughing up any blood?" If so ask: "How much?" (flecks, streaks, tablespoons, etc.)     Denies 5. DIFFICULTY BREATHING: "Are you having difficulty breathing?" If Yes, ask: "How bad is it?" (e.g., mild, moderate, severe)    - MILD: No SOB at rest, mild SOB with walking, speaks normally in sentences, can lie down, no retractions, pulse < 100.    - MODERATE: SOB at rest, SOB with minimal exertion and prefers to sit, cannot lie down flat, speaks in phrases, mild retractions, audible wheezing, pulse 100-120.    - SEVERE: Very SOB at rest, speaks in single words, struggling to breathe, sitting hunched forward, retractions, pulse > 120      Denies 6. FEVER:  "Do you have a fever?" If Yes, ask: "What is your temperature, how was it measured, and when did it start?"     Denies 7. CARDIAC HISTORY: "Do you have any history of heart disease?" (e.g., heart attack, congestive heart failure)      Denies 8. LUNG HISTORY: "Do you have any history of lung disease?"  (e.g., pulmonary embolus, asthma, emphysema)     Denies 9. PE RISK FACTORS: "Do you have a history of blood clots?" (or: recent major surgery, recent prolonged travel, bedridden)     Denies 10. OTHER SYMPTOMS: "Do you have any other symptoms?" (e.g., runny nose, wheezing, chest pain)       Fatigue, runny nose- clear,   12. TRAVEL: "Have you traveled out of the country in the last month?" (e.g., travel history, exposures)       Mid January, [place].  Protocols used: Cough - Acute Non-Productive-A-AH

## 2024-01-14 NOTE — Assessment & Plan Note (Signed)
Suspect patient has a viral upper respiratory illness.  Possibly has human metapneumovirus given her exposure to her husband.  COVID testing negative today.  Discussed treatment would be supportive.  Patient can continue Mucinex and DayQuil over-the-counter.  Tessalon provided for cough.  Advised she would likely continue to improve though if she worsens at all she should let us know.

## 2024-01-14 NOTE — Patient Instructions (Signed)
Nice to see you. If you have any worsening symptoms please let us know.

## 2024-01-14 NOTE — Telephone Encounter (Signed)
Pt is seeing Dr. Birdie Sons at 11:40

## 2024-01-14 NOTE — Telephone Encounter (Signed)
 Noted

## 2024-01-14 NOTE — Progress Notes (Unsigned)
  Marikay Alar, MD Phone: [phone]  Latasha Rios is a 63 y.o. female who presents today for same-day visit.  Cough: Patient notes onset of symptoms 4 to 5 days ago.  Notes husband was diagnosed with human metapneumovirus recently and was in the hospital.  Patient has had nonproductive cough and mild postnasal drip.  Notes her symptoms started with a sore throat.  Minimal body aches.  Tmax 99 F.  No congestion.  No shortness of breath.  No known COVID or flu exposures.  Notes she feels better today than she did yesterday.  Has taken Mucinex and DayQuil.  Social History   Tobacco Use  Smoking Status Never  Smokeless Tobacco Never    No current outpatient medications on file prior to visit.   No current facility-administered medications on file prior to visit.     ROS see history of present illness  Objective  Physical Exam Vitals:   01/14/24 1136  BP: 138/78  Pulse: 100  Resp: 16  Temp: 98.7 F (37.1 C)  SpO2: 98%    BP Readings from Last 3 Encounters:  01/14/24 138/78  09/05/23 130/70  08/30/22 110/68   Wt Readings from Last 3 Encounters:  01/14/24 143 lb 6.4 oz (65 kg)  09/05/23 143 lb 12.8 oz (65.2 kg)  08/30/22 142 lb (64.4 kg)    Physical Exam Constitutional:      General: She is not in acute distress.    Appearance: She is not diaphoretic.  HENT:     Mouth/Throat:     Mouth: Mucous membranes are moist.     Pharynx: Oropharynx is clear.  Cardiovascular:     Rate and Rhythm: Normal rate and regular rhythm.     Heart sounds: Normal heart sounds.  Pulmonary:     Effort: Pulmonary effort is normal.     Breath sounds: Normal breath sounds.  Lymphadenopathy:     Cervical: No cervical adenopathy.  Skin:    General: Skin is warm and dry.  Neurological:     Mental Status: She is alert.      Assessment/Plan: Please see individual problem list.  Viral upper respiratory illness Assessment & Plan: Suspect patient has a viral upper  respiratory illness.  Possibly has human metapneumovirus given her exposure to her husband.  COVID testing negative today.  Discussed treatment would be supportive.  Patient can continue Mucinex and DayQuil over-the-counter.  Tessalon provided for cough.  Advised she would likely continue to improve though if she worsens at all she should let us know.  Orders: -     Benzonatate; Take 1 capsule (200 mg total) by mouth 2 (two) times daily as needed for cough.  Dispense: 20 capsule; Refill: 0  Acute cough -     POC COVID-19 BinaxNow     Return if symptoms worsen or fail to improve.   Marikay Alar, MD [facility]

## 2024-01-15 LAB — POC COVID19 BINAXNOW: SARS Coronavirus 2 Ag: NEGATIVE

## 2024-01-24 ENCOUNTER — Ambulatory Visit
Admission: RE | Admit: 2024-01-24 | Discharge: 2024-01-24 | Disposition: A | Discharge status: Home / Self Care | Payer: BLUE CROSS/BLUE SHIELD | Source: Ambulatory Visit | Attending: Internal Medicine | Admitting: Internal Medicine

## 2024-01-24 DIAGNOSIS — Z1231 Encounter for screening mammogram for malignant neoplasm of breast: Secondary | ICD-10-CM | POA: Insufficient documentation

## 2024-06-03 ENCOUNTER — Telehealth: Payer: Self-pay

## 2024-06-03 NOTE — Telephone Encounter (Signed)
 Pt is due for tetanus booster. If okay to give would you like for her to have the Tdap or plain Td?

## 2024-06-03 NOTE — Telephone Encounter (Signed)
 Copied from CRM [id number]. Topic: Appointments - Scheduling Inquiry for Clinic >> Jun 03, 2024 10:14 AM [place] E wrote: Reason for CRM: Patient called wanting to schedule her TDAP booster. Callback number [phone].

## 2024-06-04 NOTE — Telephone Encounter (Signed)
 Pt can now be scheduled for a nurse visit to receive her Tdap vaccine.

## 2024-06-04 NOTE — Telephone Encounter (Signed)
 noted

## 2024-06-05 ENCOUNTER — Ambulatory Visit (INDEPENDENT_AMBULATORY_CARE_PROVIDER_SITE_OTHER)

## 2024-06-05 DIAGNOSIS — Z23 Encounter for immunization: Secondary | ICD-10-CM | POA: Diagnosis not present

## 2024-06-05 NOTE — Progress Notes (Signed)
 Patient is in office today for a nurse visit for Immunization TDAP. Patient Injection was given in the  Left deltoid. Patient tolerated injection well.

## 2024-08-14 ENCOUNTER — Ambulatory Visit: Payer: Self-pay | Admitting: *Deleted

## 2024-08-14 NOTE — Telephone Encounter (Signed)
 Patient currently out of town in [place] , recommended UC for evaluation.    FYI Only or Action Required?: Action required by provider: clinical question for provider, update on patient condition, and requesting recommendations.  Patient was last seen in primary care on 01/14/2024 by Maribeth Camellia MATSU, MD.  Called Nurse Triage reporting Back Pain.  Symptoms began several days ago. Tuesday   Interventions attempted: Rest, hydration, or home remedies.  Symptoms are: gradually worsening.  Triage Disposition: See PCP When Office is Open (Within 3 Days)  Patient/caregiver understands and will follow disposition?: Unsure               Copied from CRM [id number]. Topic: Clinical - Red Word Triage >> Aug 14, 2024  8:49 AM Mia F wrote: Red Word that prompted transfer to Nurse Triage: Severe back pain. Last night had severe stomach and back pain. Feeling very nauseous but not vomiting.  Had a sensation to urinate frequent. She says she is urinating but not a lot. Had some lower back pain Tuesday. Last night she still had lower back pain off to the left side. At times it was a sharp pain that would wake her up. Right now she has a dull back and stomach pain. Reason for Disposition  [1] Age > 50 AND [2] no history of prior similar back pain  Answer Assessment - Initial Assessment Questions Recommended UC. Patient currently out of town in [place] .       1. ONSET: When did the pain begin? (e.g., minutes, hours, days)     Tuesday  2. LOCATION: Where does it hurt? (upper, mid or lower back)     Low back low abdominal pain nausea 3. SEVERITY: How bad is the pain?  (e.g., Scale 1-10; mild, moderate, or severe)     Sharp at times but dull at times 4. PATTERN: Is the pain constant? (e.g., yes, no; constant, intermittent)      Intermittent sharp pain  5. RADIATION: Does the pain shoot into your legs or somewhere else?     na 6. CAUSE:  What do you think is causing the  back pain?      Not sure 7. BACK OVERUSE:  Any recent lifting of heavy objects, strenuous work or exercise?     na 8. MEDICINES: What have you taken so far for the pain? (e.g., nothing, acetaminophen , NSAIDS)     na 9. NEUROLOGIC SYMPTOMS: Do you have any weakness, numbness, or problems with bowel/bladder control?     na 10. OTHER SYMPTOMS: Do you have any other symptoms? (e.g., fever, abdomen pain, burning with urination, blood in urine)       Low abdominal pain , low back back sharp at times. Urinary frequency but not a lot of urine. Can urinate more than drops. Denies fever no dark urine  11. PREGNANCY: Is there any chance you are pregnant? When was your last menstrual period?       na  Protocols used: Back Pain-A-AH

## 2024-08-14 NOTE — Telephone Encounter (Signed)
 noted

## 2024-08-20 ENCOUNTER — Encounter: Payer: Self-pay | Admitting: Family

## 2024-08-20 ENCOUNTER — Ambulatory Visit: Payer: Self-pay | Admitting: Family

## 2024-08-20 ENCOUNTER — Ambulatory Visit (INDEPENDENT_AMBULATORY_CARE_PROVIDER_SITE_OTHER): Payer: Self-pay | Admitting: Family

## 2024-08-20 ENCOUNTER — Ambulatory Visit
Admission: RE | Admit: 2024-08-20 | Discharge: 2024-08-20 | Disposition: A | Discharge status: Home / Self Care | Source: Ambulatory Visit | Attending: Family | Admitting: Family

## 2024-08-20 ENCOUNTER — Ambulatory Visit (INDEPENDENT_AMBULATORY_CARE_PROVIDER_SITE_OTHER)

## 2024-08-20 VITALS — BP 122/62 | HR 69 | Temp 97.8°F | Ht 66.0 in | Wt 142.6 lb

## 2024-08-20 DIAGNOSIS — R109 Unspecified abdominal pain: Secondary | ICD-10-CM | POA: Insufficient documentation

## 2024-08-20 DIAGNOSIS — R319 Hematuria, unspecified: Secondary | ICD-10-CM

## 2024-08-20 DIAGNOSIS — M545 Low back pain, unspecified: Secondary | ICD-10-CM | POA: Diagnosis not present

## 2024-08-20 LAB — POCT URINALYSIS DIPSTICK
Glucose, UA: NEGATIVE
Nitrite, UA: NEGATIVE
Protein, UA: NEGATIVE
Spec Grav, UA: >=1.03 — AB (ref 1.010–1.025)
Urobilinogen, UA: 0.2 U/dL
pH, UA: 5.5 (ref 5.0–8.0)

## 2024-08-20 LAB — CBC WITH DIFFERENTIAL/PLATELET
Basophils Absolute: 0 K/uL (ref 0.0–0.1)
Basophils Relative: 0.5 % (ref 0.0–3.0)
Eosinophils Absolute: 0.2 K/uL (ref 0.0–0.7)
Eosinophils Relative: 4.2 % (ref 0.0–5.0)
HCT: 39.7 % (ref 36.0–46.0)
Hemoglobin: 13.5 g/dL (ref 12.0–15.0)
Lymphocytes Relative: 22.2 % (ref 12.0–46.0)
Lymphs Abs: 1.3 K/uL (ref 0.7–4.0)
MCHC: 33.9 g/dL (ref 30.0–36.0)
MCV: 92.3 fl (ref 78.0–100.0)
Monocytes Absolute: 0.8 K/uL (ref 0.1–1.0)
Monocytes Relative: 13.3 % — ABNORMAL HIGH (ref 3.0–12.0)
Neutro Abs: 3.5 K/uL (ref 1.4–7.7)
Neutrophils Relative %: 59.8 % (ref 43.0–77.0)
Platelets: 332 K/uL (ref 150.0–400.0)
RBC: 4.3 Mil/uL (ref 3.87–5.11)
RDW: 12.7 % (ref 11.5–15.5)
WBC: 5.8 K/uL (ref 4.0–10.5)

## 2024-08-20 LAB — COMPREHENSIVE METABOLIC PANEL WITH GFR
ALT: 14 U/L (ref 0–35)
AST: 15 U/L (ref 0–37)
Albumin: 4.4 g/dL (ref 3.5–5.2)
Alkaline Phosphatase: 71 U/L (ref 39–117)
BUN: 19 mg/dL (ref 6–23)
CO2: 33 meq/L — ABNORMAL HIGH (ref 19–32)
Calcium: 9.8 mg/dL (ref 8.4–10.5)
Chloride: 102 meq/L (ref 96–112)
Creatinine, Ser: 0.6 mg/dL (ref 0.40–1.20)
GFR: 94.63 mL/min (ref >60.00)
Glucose, Bld: 80 mg/dL (ref 70–99)
Potassium: 4.3 meq/L (ref 3.5–5.1)
Sodium: 140 meq/L (ref 135–145)
Total Bilirubin: 0.5 mg/dL (ref 0.2–1.2)
Total Protein: 7.1 g/dL (ref 6.0–8.3)

## 2024-08-20 LAB — URINALYSIS, ROUTINE W REFLEX MICROSCOPIC
Bilirubin Urine: NEGATIVE
Nitrite: NEGATIVE
Specific Gravity, Urine: >=1.03 — AB (ref 1.000–1.030)
Urine Glucose: NEGATIVE
Urobilinogen, UA: 0.2 (ref 0.0–1.0)
pH: 6 (ref 5.0–8.0)

## 2024-08-20 LAB — LIPASE: Lipase: 21 U/L (ref 11.0–59.0)

## 2024-08-20 NOTE — Progress Notes (Signed)
 Assessment & Plan:  Hematuria, unspecified type Assessment & Plan: Urine point-of-care trace ketones, small bilirubin, moderate blood.  Trace leukocytes, negative nitrites.  Treated in [place]  with Macrobid .  Reassuring exam today.  No CVA tenderness.  Nontoxic on exam.  Differential includes UTI, renal stone.  Associated abdominal discomfort, difficult to pass stools, episodic nausea.  Advised over-the-counter omeprazole, Colace as needed.  Pending abdominal x-ray, labs, urine studies.  Patient will let me know if any new symptoms or concerns.  Orders: -     Urinalysis, Routine w reflex microscopic -     Urine Culture -     DG Abd 1 View; Future -     CBC with Differential/Platelet -     Comprehensive metabolic panel with GFR -     Lipase  Left low back pain, unspecified chronicity, unspecified whether sciatica present -     Urinalysis, Routine w reflex microscopic -     POCT urinalysis dipstick     Return precautions given.   Risks, benefits, and alternatives of the medications and treatment plan prescribed today were discussed, and patient expressed understanding.   Education regarding symptom management and diagnosis given to patient on AVS either electronically or printed.  Return in about 2 weeks (around 09/03/2024).  Rollene Northern, FNP  Subjective:    Patient ID: Latasha Rios, female    DOB: 11-18-1961, 63 y.o.   MRN: [id number]  CC: Latasha Rios is a 63 y.o. female who presents today for an acute visit.    HPI: One week ago, she severe low back pain which woke up in the middle of the night. Worse on left low back. Describes as sharp.  She had associated abdominal pain, nausea.   Denies fever, chills  She was seen at [facility] in [place]. She was treated with macrobid  for 5 days and an injection which she thinks was toradol IM. She was also given zofran .   She was called regarding urine culture and told she had group B strep.   Reports  traveling to [place] and she has been playing golf.    Back pain has improved. Nausea has improved and it now episodic. Nausea better if she eats. She has been using Advil every 4-5 hrs for 2-3 days.  No alcohol use.  Her stomach 'feels out of wack'.  Endorse more gas 2-3 days ago.  Last BM today which difficult to pass. Nonbloody.  No h/o recurrent UTIs. No h/o CKD, renal stone. No h/o diverticulitis.      Cologuard 09/21/2021. H/o lumbar surgery.   Allergies: Patient has no known allergies. Current Outpatient Medications on File Prior to Visit  Medication Sig Dispense Refill   benzonatate  (TESSALON ) 200 MG capsule Take 1 capsule (200 mg total) by mouth 2 (two) times daily as needed for cough. (Patient not taking: Reported on 08/20/2024) 20 capsule 0   No current facility-administered medications on file prior to visit.    Review of Systems  Constitutional:  Negative for chills and fever.  HENT:  Negative for congestion.   Respiratory:  Negative for cough and shortness of breath.   Cardiovascular:  Negative for chest pain and palpitations.  Gastrointestinal:  Positive for constipation (difficult to pass stools) and nausea. Negative for abdominal pain and vomiting.  Genitourinary:  Negative for difficulty urinating, dysuria and hematuria.      Objective:    BP 122/62   Pulse 69   Temp 97.8 F (36.6 C) (Oral)  Ht 5' 6 (1.676 m)   Wt 142 lb 9.6 oz (64.7 kg)   LMP 12/28/2014 (Approximate)   SpO2 95%   BMI 23.02 kg/m   BP Readings from Last 3 Encounters:  08/20/24 122/62  01/14/24 138/78  09/05/23 130/70   Wt Readings from Last 3 Encounters:  08/20/24 142 lb 9.6 oz (64.7 kg)  01/14/24 143 lb 6.4 oz (65 kg)  09/05/23 143 lb 12.8 oz (65.2 kg)    Physical Exam Vitals reviewed.  Constitutional:      Appearance: Normal appearance. She is well-developed.  Eyes:     Conjunctiva/sclera: Conjunctivae normal.  Cardiovascular:     Rate and Rhythm: Normal rate  and regular rhythm.     Pulses: Normal pulses.     Heart sounds: Normal heart sounds.  Pulmonary:     Effort: Pulmonary effort is normal.     Breath sounds: Normal breath sounds. No wheezing, rhonchi or rales.  Abdominal:     General: Bowel sounds are normal. There is no distension.     Palpations: Abdomen is soft. Abdomen is not rigid. There is no fluid wave or mass.     Tenderness: There is no abdominal tenderness. There is no guarding or rebound.  Musculoskeletal:     Lumbar back: No swelling, edema, spasms, tenderness or bony tenderness. Normal range of motion. Negative right straight leg raise test and negative left straight leg raise test.     Comments: Full range of motion with flexion, tension, lateral side bends. No bony tenderness. No pain, numbness, tingling elicited with single leg raise bilaterally.   Skin:    General: Skin is warm and dry.  Neurological:     Mental Status: She is alert.     Sensory: No sensory deficit.     Deep Tendon Reflexes:     Reflex Scores:      Patellar reflexes are 2+ on the right side and 2+ on the left side.    Comments: Sensation and strength intact bilateral lower extremities.  Psychiatric:        Speech: Speech normal.        Behavior: Behavior normal.        Thought Content: Thought content normal.

## 2024-08-20 NOTE — Patient Instructions (Signed)
 I would recommend trial of over-the-counter Prilosec in the setting of nausea as discussed, this may be a symptom of underlying gastritis after NSAID use. Pending x-ray, urinalysis, urine culture and labs.  Please let me know any new concerns or symptoms.

## 2024-08-20 NOTE — Assessment & Plan Note (Addendum)
 Urine point-of-care trace ketones, small bilirubin, moderate blood.  Trace leukocytes, negative nitrites.  Treated in [place]  with Macrobid .  Reassuring exam today.  No CVA tenderness.  Nontoxic on exam.  Differential includes UTI, renal stone.  Associated abdominal discomfort, difficult to pass stools, episodic nausea.  Advised over-the-counter omeprazole, Colace as needed.  Pending abdominal x-ray, labs, urine studies.  Patient will let me know if any new symptoms or concerns.

## 2024-08-21 ENCOUNTER — Ambulatory Visit: Payer: Self-pay | Admitting: Family

## 2024-08-21 ENCOUNTER — Other Ambulatory Visit: Payer: Self-pay | Admitting: Family

## 2024-08-21 ENCOUNTER — Other Ambulatory Visit: Payer: Self-pay

## 2024-08-21 DIAGNOSIS — N200 Calculus of kidney: Secondary | ICD-10-CM

## 2024-08-21 LAB — URINE CULTURE
MICRO NUMBER:: 17011243
SPECIMEN QUALITY:: ADEQUATE

## 2024-08-22 ENCOUNTER — Other Ambulatory Visit: Payer: Self-pay

## 2024-08-22 ENCOUNTER — Telehealth: Payer: Self-pay

## 2024-08-22 DIAGNOSIS — R899 Unspecified abnormal finding in specimens from other organs, systems and tissues: Secondary | ICD-10-CM

## 2024-08-22 NOTE — Telephone Encounter (Signed)
 Dineen Rollene MATSU, FNP to [facility]  (Selected Message)    08/22/24  6:24 AM Result Note Order CBC with differential and schedule per result note.

## 2024-08-31 NOTE — Progress Notes (Unsigned)
 09/02/2024 9:24 AM   Latasha Rios 01/11/1961 [id number]  Referring provider: Dineen Rollene KANDICE, FNP [address] [place],  [place] [address]  Urological history: 1. None  Chief Complaint  Patient presents with   Nephrolithiasis    Renal stone kub follow up   HPI: Latasha Rios is a 63 y.o. woman who presents today for stones  Previous records reviewed.  On August 14, 2024, she reached out to her PCPs office with complaints of severe back pain off to the left side, nausea, and urinary frequency.  She was seen at an urgent care in [place]  and given Macrobid .  She was seen in her PCPs office on August 20, 2024.  She was told by the urgent care in [place]  that she had group B strep growing out on her urine culture.  Her back pain had improved.  Her nausea was also improving.  Her urinalysis at her PCPs office was yellow turbid, specific gravity greater than 1.030, trace protein, trace ketone, small heme, trace leukocyte, 3-5 WBCs, 3-6 RBCs, mucus present and amorphous crystals present her urine culture with her PCP grew out less than 10,000 colonies of a single gram-positive organism.  UA yellow clear, specific every 1.020, pH 6.0, 1+ heme, 2+ leuks, 11-30 WBCs, 3-10 RBCs, greater than 10 epithelial cells, hyaline cast present, mucus at present and moderate bacteria.  Serum creatinine (07/2024) 0.60, eGFR 94.63  CT stone protocol (07/2024)  Several bilateral nonobstructing intrarenal stones.  Largest in the right lower pole measures 6 mm.  KUB 6 mm right lower pole stone visible and a 3 mm left lower pole stone is visible.   She feels well today.  She still having some lower back pain, but she feels it is more MSK as it is going down her right buttocks into the back of her leg.  She is not having the urinary frequency anymore and the nausea is almost abated.  Patient denies any modifying or aggravating factors.  Patient denies any recent UTI's,  gross hematuria, dysuria or suprapubic/flank pain.  Patient denies any fevers, chills, or vomiting.    She does not have a history of stone.  PMH: Past Medical History:  Diagnosis Date   Tinea capitis 07/08/2017    Surgical History: Past Surgical History:  Procedure Laterality Date   BREAST SURGERY  2011   Reduction   CESAREAN SECTION  1993   REDUCTION MAMMAPLASTY Bilateral 2014   TONSILLECTOMY AND ADENOIDECTOMY  1965   TUBAL LIGATION      Home Medications:  Allergies as of 09/02/2024   No Known Allergies      Medication List        Accurate as of September 02, 2024  9:24 AM. If you have any questions, ask your nurse or doctor.          STOP taking these medications    benzonatate  200 MG capsule Commonly known as: TESSALON  Stopped by: Latasha Rios       TAKE these medications    nitrofurantoin  (macrocrystal-monohydrate) 100 MG capsule Commonly known as: MACROBID  Take 100 mg by mouth every 12 (twelve) hours.   ondansetron  4 MG disintegrating tablet Commonly known as: ZOFRAN -ODT Take 4 mg by mouth every 6 (six) hours as needed.        Allergies: No Known Allergies  Family History: Family History  Problem Relation Age of Onset   Cancer Mother        in situ breast  Breast cancer Mother 62   Hypertension Father    Heart disease Father 57       died of pneumonia   Hypertension Sister    Breast cancer Sister 52   Cancer Sister    Down syndrome Brother    Leukemia Brother        in remission    Hypertension Brother    Hypertension Paternal Grandmother    Diabetes Paternal Grandmother    Hypertension Paternal Grandfather    Diabetes Paternal Grandfather     Social History:  reports that she has never smoked. She has never used smokeless tobacco. She reports that she does not currently use alcohol. She reports that she does not use drugs.  ROS: Pertinent ROS in HPI  Physical Exam: BP (!) 150/88 (BP Location: Left Arm, Patient  Position: Sitting, Cuff Size: Normal)   Pulse 76   Ht 5' 6 (1.676 m)   Wt 139 lb (63 kg)   LMP 12/28/2014 (Approximate)   BMI 22.44 kg/m   Constitutional:  Well nourished. Alert and oriented, No acute distress. HEENT: [place] AT, moist mucus membranes.  Trachea midline Cardiovascular: No clubbing, cyanosis, or edema. Respiratory: Normal respiratory effort, no increased work of breathing. Neurologic: Grossly intact, no focal deficits, moving all 4 extremities. Psychiatric: Normal mood and affect.    Laboratory Data: Lab Results  Component Value Date   WBC 5.8 08/20/2024   HGB 13.5 08/20/2024   HCT 39.7 08/20/2024   MCV 92.3 08/20/2024   PLT 332.0 08/20/2024    Lab Results  Component Value Date   CREATININE 0.60 08/20/2024   Lab Results  Component Value Date   HGBA1C 5.8 09/17/2023    Urinalysis    Component Value Date/Time   COLORURINE YELLOW 08/20/2024 0916   APPEARANCEUR Turbid (A) 08/20/2024 0916   LABSPEC >=1.030 (A) 08/20/2024 0916   PHURINE 6.0 08/20/2024 0916   GLUCOSEU NEGATIVE 08/20/2024 0916   HGBUR SMALL (A) 08/20/2024 0916   BILIRUBINUR NEGATIVE 08/20/2024 0916   BILIRUBINUR small 08/20/2024 0855   KETONESUR TRACE (A) 08/20/2024 0916   PROTEINUR Negative 08/20/2024 0855   UROBILINOGEN 0.2 08/20/2024 0916   UROBILINOGEN 0.2 08/20/2024 0855   NITRITE NEGATIVE 08/20/2024 0916   NITRITE negative 08/20/2024 0855   LEUKOCYTESUR TRACE (A) 08/20/2024 0916   LEUKOCYTESUR Trace (A) 08/20/2024 0855    Lab Results  Component Value Date   MUCUS Presence of (A) 08/20/2024  I have reviewed the labs.  See HPI.    Pertinent Imaging: CLINICAL DATA:  Abdominal and flank pain with stone suspected. 6 mm right renal stone.   EXAM: CT ABDOMEN AND PELVIS WITHOUT CONTRAST   TECHNIQUE: Multidetector CT imaging of the abdomen and pelvis was performed following the standard protocol without IV contrast.   RADIATION DOSE REDUCTION: This exam was performed according to  the departmental dose-optimization program which includes automated exposure control, adjustment of the mA and/or kV according to patient size and/or use of iterative reconstruction technique.   COMPARISON:  Abdominal radiograph 08/20/2024   FINDINGS: Lower chest: Mild dependent atelectasis in the lung bases.   Hepatobiliary: No focal liver abnormality is seen. No gallstones, gallbladder wall thickening, or biliary dilatation.   Pancreas: Unremarkable. No pancreatic ductal dilatation or surrounding inflammatory changes.   Spleen: Normal in size without focal abnormality.   Adrenals/Urinary Tract: No adrenal gland nodules. Several stones in both kidneys. Largest is in the right lower pole measuring 6 mm diameter. This corresponds to stone seen on prior  radiograph. No hydronephrosis or hydroureter. No ureteral stones. Bladder is decompressed with normal appearance.   Stomach/Bowel: Stomach is within normal limits. Appendix appears normal. No evidence of bowel wall thickening, distention, or inflammatory changes.   Vascular/Lymphatic: No significant vascular findings are present. No enlarged abdominal or pelvic lymph nodes.   Reproductive: Uterus and bilateral adnexa are unremarkable.   Other: No abdominal wall hernia or abnormality. No abdominopelvic ascites.   Musculoskeletal: No acute or significant osseous findings.   IMPRESSION: 1. Several bilateral nonobstructing intrarenal stones. Largest in the right lower pole measures 6 mm. 2. No ureteral stone or obstruction. 3. No acute process demonstrated in the abdomen or pelvis.     Electronically Signed   By: Elsie Gravely M.D.   On: 08/20/2024 18:16 I have independently reviewed the films.  See HPI.    Assessment & Plan:    1. Bilateral nephrolithiasis - UA still w/ micro heme - urine culture pending; will hold on prescribing antibiotic till culture results are available - KUB bilateral stones  - given stone  diet in AVS - Advised her to contact us  if she should experience return of her symptoms - We discussed that kidney stones are difficult to predict and we cannot be sure when or if her other renal stones will migrate into her ureters - continue to monitor   2. Microscopic hematuria - Likely from the recent passage of a stone and residual inflammation - Urine culture is pending - Will repeat UA, urine culture in 2 months to ensure resolution  Return in about 2 months (around 11/02/2024) for UA, UCX only .  These notes generated with voice recognition software. I apologize for typographical errors.  Latasha HELON RIGGERS  [facility] [address]  [address] [place], [place] [address] [phone]

## 2024-09-02 ENCOUNTER — Encounter: Payer: Self-pay | Admitting: Urology

## 2024-09-02 ENCOUNTER — Ambulatory Visit: Admitting: Urology

## 2024-09-02 ENCOUNTER — Ambulatory Visit
Admission: RE | Admit: 2024-09-02 | Discharge: 2024-09-02 | Disposition: A | Discharge status: Home / Self Care | Source: Ambulatory Visit | Attending: Urology | Admitting: Urology

## 2024-09-02 ENCOUNTER — Ambulatory Visit: Admitting: Internal Medicine

## 2024-09-02 VITALS — BP 150/88 | HR 76 | Ht 66.0 in | Wt 139.0 lb

## 2024-09-02 DIAGNOSIS — N200 Calculus of kidney: Secondary | ICD-10-CM

## 2024-09-02 DIAGNOSIS — R3129 Other microscopic hematuria: Secondary | ICD-10-CM | POA: Diagnosis not present

## 2024-09-02 LAB — URINALYSIS, COMPLETE
Bilirubin, UA: NEGATIVE
Glucose, UA: NEGATIVE
Ketones, UA: NEGATIVE
Nitrite, UA: NEGATIVE
Protein,UA: NEGATIVE
Specific Gravity, UA: 1.02 (ref 1.005–1.030)
Urobilinogen, Ur: 0.2 mg/dL (ref 0.2–1.0)
pH, UA: 6 (ref 5.0–7.5)

## 2024-09-02 LAB — MICROSCOPIC EXAMINATION: Epithelial Cells (non renal): >10 /HPF — AB (ref 0–10)

## 2024-09-02 NOTE — Patient Instructions (Signed)
Kidney stones can form due to one or all of the following:   1. Low fluid Intake  2. Low citrate in the urine  3. Too much salt, oxalate, and animal protein   The following dietary changes may decrease your risk of forming stones.   1. Increase your fluid intake to around 2.5 - 3 liters per day. Diluting the urine hinders the formation of stones. You should drink enough fluid to make 2 liters of urine a day. You know you are drinking enough fluid if your urine is clear.   2. Decrease your salt intake to less than 2000mg/day  This will decrease the amount of calcium excreted in your urine. Eat more fresh or frozen vegetables instead of canned. Eat less processed meats. At restaurants request your food to be prepared without salt or high sodium seasoning.   3. Limit the amount of Oxalate containing foods in your diet to 50mg per day.  This will decrease the amount of oxalate excreted in your urine.   Spinach  Potato Nuts  Peanut Butter  Chocolate   4. Limit the amount of animal proteins in your diet to approximately the size of 1 deck of cards per meal This will decrease the amount of uric acid excreted in your urine.   Fish  Liver  Chicken  Red Meat - no more than 3 servings per week   5. Increase the amount of citrate in your diet. Most fruits and vegetables are high in citrate. Increasing the amount of fruit and vegetable your diet will raise your citrate.  Lemons and limes have the highest amount of citrate. A recipe for lemonade may be helpful to increase citrate in your diet: 1/2 cup concentrated lemon juice to 2 quarts of water. Sweeten to taste.  

## 2024-09-10 ENCOUNTER — Other Ambulatory Visit

## 2024-09-11 ENCOUNTER — Other Ambulatory Visit

## 2024-09-12 ENCOUNTER — Other Ambulatory Visit (INDEPENDENT_AMBULATORY_CARE_PROVIDER_SITE_OTHER)

## 2024-09-12 DIAGNOSIS — R899 Unspecified abnormal finding in specimens from other organs, systems and tissues: Secondary | ICD-10-CM

## 2024-09-12 LAB — CBC WITH DIFFERENTIAL/PLATELET
Basophils Absolute: 0.1 K/uL (ref 0.0–0.1)
Basophils Relative: 1.3 % (ref 0.0–3.0)
Eosinophils Absolute: 0.3 K/uL (ref 0.0–0.7)
Eosinophils Relative: 5.1 % — ABNORMAL HIGH (ref 0.0–5.0)
HCT: 40 % (ref 36.0–46.0)
Hemoglobin: 13.4 g/dL (ref 12.0–15.0)
Lymphocytes Relative: 21.8 % (ref 12.0–46.0)
Lymphs Abs: 1.1 K/uL (ref 0.7–4.0)
MCHC: 33.5 g/dL (ref 30.0–36.0)
MCV: 93.7 fl (ref 78.0–100.0)
Monocytes Absolute: 0.5 K/uL (ref 0.1–1.0)
Monocytes Relative: 10.3 % (ref 3.0–12.0)
Neutro Abs: 3.2 K/uL (ref 1.4–7.7)
Neutrophils Relative %: 61.5 % (ref 43.0–77.0)
Platelets: 334 K/uL (ref 150.0–400.0)
RBC: 4.27 Mil/uL (ref 3.87–5.11)
RDW: 12.9 % (ref 11.5–15.5)
WBC: 5.3 K/uL (ref 4.0–10.5)

## 2024-09-15 ENCOUNTER — Ambulatory Visit: Payer: Self-pay | Admitting: Family

## 2024-10-14 NOTE — Telephone Encounter (Signed)
 open in error

## 2024-11-03 ENCOUNTER — Other Ambulatory Visit

## 2024-11-03 DIAGNOSIS — N200 Calculus of kidney: Secondary | ICD-10-CM

## 2024-11-04 ENCOUNTER — Other Ambulatory Visit

## 2024-11-04 ENCOUNTER — Ambulatory Visit: Payer: Self-pay | Admitting: Urology

## 2024-11-04 LAB — MICROSCOPIC EXAMINATION: Epithelial Cells (non renal): >10 /HPF — AB (ref 0–10)

## 2024-11-04 LAB — URINALYSIS, COMPLETE
Bilirubin, UA: NEGATIVE
Glucose, UA: NEGATIVE
Ketones, UA: NEGATIVE
Nitrite, UA: NEGATIVE
Protein,UA: NEGATIVE
Specific Gravity, UA: 1.03 (ref 1.005–1.030)
Urobilinogen, Ur: 0.2 mg/dL (ref 0.2–1.0)
pH, UA: 6 (ref 5.0–7.5)

## 2024-11-07 LAB — CULTURE, URINE COMPREHENSIVE

## 2024-11-07 MED ORDER — FLUCONAZOLE 150 MG PO TABS: 150.0000 mg | ORAL_TABLET | Freq: Once | ORAL | 0 refills | Status: AC

## 2024-11-07 MED ORDER — AMOXICILLIN-POT CLAVULANATE 875-125 MG PO TABS: 1.0000 | ORAL_TABLET | Freq: Two times a day (BID) | ORAL | 0 refills | Status: AC

## 2024-11-07 NOTE — Telephone Encounter (Signed)
 Per DPR ok to leave detailed message on patient cell# [phone]. Results message was given. Due to the upcoming weekend detailed message was left.   Sent medications in to [facility]   [facility] LPN

## 2024-11-07 NOTE — Telephone Encounter (Signed)
-----   Message from [facility] sent at 11/07/2024  8:37 AM EST ----- Would you let Latasha Rios know that her urine culture still in preliminary status, but I do want to get her started on a antibiotic before the weekend.  I would like for her to start Augmentin  875/125 twice daily for 7 days and we also sent a prescription for Diflucan 150 mg in case she gets a yeast infection while on the antibiotic.

## 2024-11-10 NOTE — Telephone Encounter (Signed)
-----   Message from [facility] sent at 11/09/2024  8:43 PM EST ----- Would you make sure that Latasha Rios picked up the Augmentin ?  I would also like to recheck her urine again in two months to make sure the infection was adequately treated

## 2024-11-10 NOTE — Telephone Encounter (Signed)
 Left VM message in regards to patient calling the office back to make sure she has picked up and taken her Abx that was sent in on Friday. DPR states that a detailed message could be left on patient VM.  Andrea Kirks LPN

## 2024-11-11 NOTE — Telephone Encounter (Signed)
-----   Message from [facility] sent at 11/09/2024  8:43 PM EST ----- Would you make sure that Latasha Rios picked up the Augmentin ?  I would also like to recheck her urine again in two months to make sure the infection was adequately treated

## 2024-11-11 NOTE — Telephone Encounter (Signed)
 Left VM message in regards to picking up Abx that was prescribed and checking on patient symptoms. Advised patient to contact office to follow-up.  Andrea Kirks LPN

## 2024-12-11 ENCOUNTER — Other Ambulatory Visit: Payer: Self-pay | Admitting: Medical Genetics

## 2024-12-13 ENCOUNTER — Other Ambulatory Visit
Admission: RE | Admit: 2024-12-13 | Discharge: 2024-12-13 | Disposition: A | Payer: Self-pay | Source: Ambulatory Visit | Attending: Medical Genetics | Admitting: Medical Genetics

## 2024-12-17 ENCOUNTER — Ambulatory Visit (INDEPENDENT_AMBULATORY_CARE_PROVIDER_SITE_OTHER): Admitting: Internal Medicine

## 2024-12-17 ENCOUNTER — Encounter: Payer: Self-pay | Admitting: Internal Medicine

## 2024-12-17 VITALS — BP 124/90 | HR 98 | Ht 66.0 in | Wt 147.6 lb

## 2024-12-17 DIAGNOSIS — G4733 Obstructive sleep apnea (adult) (pediatric): Secondary | ICD-10-CM

## 2024-12-17 DIAGNOSIS — Z0000 Encounter for general adult medical examination without abnormal findings: Secondary | ICD-10-CM | POA: Diagnosis not present

## 2024-12-17 DIAGNOSIS — Z0001 Encounter for general adult medical examination with abnormal findings: Secondary | ICD-10-CM

## 2024-12-17 DIAGNOSIS — R030 Elevated blood-pressure reading, without diagnosis of hypertension: Secondary | ICD-10-CM

## 2024-12-17 DIAGNOSIS — Z23 Encounter for immunization: Secondary | ICD-10-CM

## 2024-12-17 DIAGNOSIS — N200 Calculus of kidney: Secondary | ICD-10-CM | POA: Diagnosis not present

## 2024-12-17 DIAGNOSIS — E782 Mixed hyperlipidemia: Secondary | ICD-10-CM

## 2024-12-17 DIAGNOSIS — Z1231 Encounter for screening mammogram for malignant neoplasm of breast: Secondary | ICD-10-CM

## 2024-12-17 DIAGNOSIS — R7301 Impaired fasting glucose: Secondary | ICD-10-CM | POA: Diagnosis not present

## 2024-12-17 DIAGNOSIS — N390 Urinary tract infection, site not specified: Secondary | ICD-10-CM

## 2024-12-17 DIAGNOSIS — R5383 Other fatigue: Secondary | ICD-10-CM

## 2024-12-17 DIAGNOSIS — Z780 Asymptomatic menopausal state: Secondary | ICD-10-CM | POA: Diagnosis not present

## 2024-12-17 DIAGNOSIS — Z1211 Encounter for screening for malignant neoplasm of colon: Secondary | ICD-10-CM

## 2024-12-17 NOTE — Assessment & Plan Note (Signed)
 Noted bilaterally  on recent CT . 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  Rechecking urine today.  Last treated for E Coli UTI with augmentin  Dec 12

## 2024-12-17 NOTE — Assessment & Plan Note (Signed)
Moderately severe,  With desats to 76%  .  CPAP setting optimal at 5 cm H20 with resolution of desats.  (ordered by Erline Hauhap mcQueen 2017). She is wearing her CPAP every night a minimum of 6 hours per night and notes improved daytime wakefulness and decreased fatigue

## 2024-12-17 NOTE — Assessment & Plan Note (Signed)

## 2024-12-17 NOTE — Assessment & Plan Note (Signed)
 She is managing her OSA wit CPAP, avoids salt and exercises regularly.  Does not smoke. She  has been asked to check her BP at work and  submit readings for evaluation. Renal function will be checked today along with screening for proteinuria

## 2024-12-17 NOTE — Patient Instructions (Addendum)
" °  Your blood pressure has been high on two occasions recently.   (Normal is 120/80 or lower ) Please check your blood pressure  5 times over the next 2 weeks and send me the readings  in one month so I can determine if you need to start  medication to lower your blood pressure .    I will initiate the order for your colon cancer screening  Test, the one called  Cologuard.  It is due now   It will be delivered to your house, and you will send off a stool sample in the envelope it provides.    Your annual mammogram AND  DEXA  SCAN have been ordered.  Please call [facility] to call to make your appointments  .  The phone number for [place] is  [phone]    Staying well hydrated and acidifying your water with fresh citrus will help prevent more kidney stones   "

## 2024-12-17 NOTE — Progress Notes (Unsigned)
 Patient ID: Latasha Rios, female    DOB: 01/26/1961  Age: 63 y.o. MRN: [id number]  The patient is here for annual preventive examination and management of other chronic and acute problems.   The risk factors are reflected in the social history.   The roster of all physicians providing medical care to patient - is listed in the Snapshot section of the chart.   Activities of daily living:  The patient is 100% independent in all ADLs: dressing, toileting, feeding as well as independent mobility   Home safety : The patient has smoke detectors in the home. They wear seatbelts.  There are no unsecured firearms at home. There is no violence in the home.    There is no risks for hepatitis, STDs or HIV. There is no   history of blood transfusion. They have no travel history to infectious disease endemic areas of the world.   The patient has seen their dentist in the last six month. They have seen their eye doctor in the last year. The patinet  denies slight hearing difficulty with regard to whispered voices and some television programs.  They have deferred audiologic testing in the last year.  They do not  have excessive sun exposure. Discussed the need for sun protection: hats, long sleeves and use of sunscreen if there is significant sun exposure.    Diet: the importance of a healthy diet is discussed. They do have a healthy diet.   The benefits of regular aerobic exercise were discussed. The patient  exercises  3 to 5 days per week  for  60 minutes.    Depression screen: there are no signs or vegative symptoms of depression- irritability, change in appetite, anhedonia, sadness/tearfullness.   The following portions of the patient's history were reviewed and updated as appropriate: allergies, current medications, past family history, past medical history,  past surgical history, past social history  and problem list.   Visual acuity was not assessed per patient preference since the patient has  regular follow up with an  ophthalmologist. Hearing and body mass index were assessed and reviewed.    During the course of the visit the patient was educated and counseled about appropriate screening and preventive services including : fall prevention , diabetes screening, nutrition counseling, colorectal cancer screening, and recommended immunizations.    Chief Complaint:   RLE laceration treated Jan 8 with steri strips  by [facility] provider .  Healing well . 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  No fall   Nephrolothiasis :  she Passed a kidney stone in September.  CT showed multiple stones   hjs seen urology,  recurrent abnormal urine without overt symptoms  of UTI treated  Dec 12 to Dec 19 with   augmentin  for UTI   Recurrent UTI Last abx treatment Dec 8 for UTI E Coli         Review of Symptoms  Patient denies headache, fevers, malaise, unintentional weight loss, skin rash, eye pain, sinus congestion and sinus pain, sore throat, dysphagia,  hemoptysis , cough, dyspnea, wheezing, chest pain, palpitations, orthopnea, edema, abdominal pain, nausea, melena, diarrhea, constipation, flank pain, dysuria, hematuria, urinary  Frequency, nocturia, numbness, tingling, seizures,  Focal weakness, Loss of consciousness,  Tremor, insomnia, depression, anxiety, and suicidal ideation.    Physical Exam:  BP (!) 124/90   Pulse 98   Ht 5' 6 (1.676 m)   Wt 147 lb 9.6 oz (67 kg)   LMP 12/28/2014  SpO2 96%   BMI 23.82 kg/m    Physical Exam Vitals reviewed.  Constitutional:      General: She is not in acute distress.    Appearance: Normal appearance. She is well-developed and normal weight. She is not ill-appearing, toxic-appearing or diaphoretic.  HENT:     Head: Normocephalic.     Right Ear: Tympanic membrane, ear canal and external ear normal. There is no impacted cerumen.     Left Ear: Tympanic membrane, ear canal and external ear normal. There is no impacted cerumen.     Nose:  Nose normal.     Mouth/Throat:     Mouth: Mucous membranes are moist.     Pharynx: Oropharynx is clear.  Eyes:     General: No scleral icterus.       Right eye: No discharge.        Left eye: No discharge.     Conjunctiva/sclera: Conjunctivae normal.     Pupils: Pupils are equal, round, and reactive to light.  Neck:     Thyroid : No thyromegaly.     Vascular: No carotid bruit or JVD.  Cardiovascular:     Rate and Rhythm: Normal rate and regular rhythm.     Heart sounds: Normal heart sounds.  Pulmonary:     Effort: Pulmonary effort is normal. No respiratory distress.     Breath sounds: Normal breath sounds.  Chest:  Breasts:    Breasts are symmetrical.     Right: Normal. No swelling, inverted nipple, mass, nipple discharge, skin change or tenderness.     Left: Normal. No swelling, inverted nipple, mass, nipple discharge, skin change or tenderness.     Comments: Breast reduction scars, well healed  Abdominal:     General: Bowel sounds are normal.     Palpations: Abdomen is soft. There is no mass.     Tenderness: There is no abdominal tenderness. There is no guarding or rebound.  Musculoskeletal:        General: Normal range of motion.     Cervical back: Normal range of motion and neck supple.  Lymphadenopathy:     Cervical: No cervical adenopathy.     Upper Body:     Right upper body: No supraclavicular, axillary or pectoral adenopathy.     Left upper body: No supraclavicular, axillary or pectoral adenopathy.  Skin:    General: Skin is warm and dry.  Neurological:     General: No focal deficit present.     Mental Status: She is alert and oriented to person, place, and time. Mental status is at baseline.  Psychiatric:        Mood and Affect: Mood normal.        Behavior: Behavior normal.        Thought Content: Thought content normal.        Judgment: Judgment normal.     Assessment and Plan: Encounter for screening mammogram for malignant neoplasm of breast -     3D  Screening Mammogram, Left and Right; Future  Postmenopausal estrogen deficiency -     DG Bone Density; Future  Colon cancer screening -     Cologuard  Moderate mixed hyperlipidemia not requiring statin therapy -     Lipid panel -     LDL cholesterol, direct  Other fatigue -     CBC with Differential/Platelet -     TSH  Impaired fasting glucose -     Comprehensive metabolic panel with GFR -  Hemoglobin A1c  Elevated blood-pressure reading without diagnosis of hypertension -     Comprehensive metabolic panel with GFR -     Microalbumin / creatinine urine ratio  Encounter for preventative adult health care examination  Recurrent UTI -     Urinalysis, Routine w reflex microscopic -     Urine Culture  Need for influenza vaccination -     Flu vaccine trivalent PF, 6mos and older(Flulaval,Afluria,Fluarix,Fluzone)  Need for pneumococcal 20-valent conjugate vaccination -     Pneumococcal conjugate vaccine 20-valent  Elevated blood pressure reading in office without diagnosis of hypertension Assessment & Plan: She is managing her OSA wit CPAP, avoids salt and exercises regularly.  Does not smoke. She  has been asked to check her BP at work and  submit readings for evaluation. Renal function will be checked today along with screening for proteinuria   Nephrolithiasis Assessment & Plan: Noted bilaterally  on recent CT . 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  Rechecking urine today.  Last treated for E Coli UTI with augmentin  Dec 12    OSA (obstructive sleep apnea) Assessment & Plan: Moderately severe,  With desats to 76%  .  CPAP setting optimal at 5 cm H20 with resolution of desats.  (ordered by Wilmer Hasten 2017). She is wearing her CPAP every night a minimum of 6 hours per night and notes improved daytime wakefulness and decreased fatigue    Encounter for preventive health examination Assessment & Plan: age appropriate education and counseling updated, referrals for  preventative services and immunizations addressed, dietary and smoking counseling addressed, most recent labs reviewed.  I have personally reviewed and have noted:   1) the patient's medical and social history 2) The pt's use of alcohol, tobacco, and illicit drugs 3) The patient's current medications and supplements 4) Functional ability including ADL's, fall risk, home safety risk, hearing and visual impairment 5) Diet and physical activities 6) Evidence for depression or mood disorder 7) The patient's height, weight, and BMI have been recorded in the chart   I have made referrals, and provided counseling and education based on review of the above      Return in about 6 months (around 06/16/2025).  Verneita LITTIE Kettering, MD

## 2024-12-18 LAB — CBC WITH DIFFERENTIAL/PLATELET
Basophils Absolute: 0.1 K/uL (ref 0.0–0.1)
Basophils Relative: 0.8 % (ref 0.0–3.0)
Eosinophils Absolute: 0.2 K/uL (ref 0.0–0.7)
Eosinophils Relative: 2.5 % (ref 0.0–5.0)
HCT: 39.2 % (ref 36.0–46.0)
Hemoglobin: 13.4 g/dL (ref 12.0–15.0)
Lymphocytes Relative: 22.4 % (ref 12.0–46.0)
Lymphs Abs: 1.6 K/uL (ref 0.7–4.0)
MCHC: 34.2 g/dL (ref 30.0–36.0)
MCV: 92.7 fl (ref 78.0–100.0)
Monocytes Absolute: 0.8 K/uL (ref 0.1–1.0)
Monocytes Relative: 11.1 % (ref 3.0–12.0)
Neutro Abs: 4.6 K/uL (ref 1.4–7.7)
Neutrophils Relative %: 63.2 % (ref 43.0–77.0)
Platelets: 351 K/uL (ref 150.0–400.0)
RBC: 4.22 Mil/uL (ref 3.87–5.11)
RDW: 12.4 % (ref 11.5–15.5)
WBC: 7.3 K/uL (ref 4.0–10.5)

## 2024-12-18 LAB — COMPREHENSIVE METABOLIC PANEL WITH GFR
ALT: 14 U/L (ref 3–35)
AST: 18 U/L (ref 5–37)
Albumin: 4.5 g/dL (ref 3.5–5.2)
Alkaline Phosphatase: 72 U/L (ref 39–117)
BUN: 15 mg/dL (ref 6–23)
CO2: 32 meq/L (ref 19–32)
Calcium: 9.6 mg/dL (ref 8.4–10.5)
Chloride: 100 meq/L (ref 96–112)
Creatinine, Ser: 0.56 mg/dL (ref 0.40–1.20)
GFR: 95.99 mL/min
Glucose, Bld: 81 mg/dL (ref 70–99)
Potassium: 4.6 meq/L (ref 3.5–5.1)
Sodium: 138 meq/L (ref 135–145)
Total Bilirubin: 0.4 mg/dL (ref 0.2–1.2)
Total Protein: 7.4 g/dL (ref 6.0–8.3)

## 2024-12-18 LAB — URINALYSIS, ROUTINE W REFLEX MICROSCOPIC
Bilirubin Urine: NEGATIVE
Ketones, ur: NEGATIVE
Nitrite: NEGATIVE
Specific Gravity, Urine: 1.015 (ref 1.000–1.030)
Total Protein, Urine: NEGATIVE
Urine Glucose: NEGATIVE
Urobilinogen, UA: 0.2 (ref 0.0–1.0)
pH: 6.5 (ref 5.0–8.0)

## 2024-12-18 LAB — LIPID PANEL
Cholesterol: 251 mg/dL — ABNORMAL HIGH (ref 28–200)
HDL: 48.6 mg/dL
LDL Cholesterol: 171 mg/dL — ABNORMAL HIGH (ref 10–99)
NonHDL: 202.52
Total CHOL/HDL Ratio: 5
Triglycerides: 158 mg/dL — ABNORMAL HIGH (ref 10.0–149.0)
VLDL: 31.6 mg/dL (ref 0.0–40.0)

## 2024-12-18 LAB — MICROALBUMIN / CREATININE URINE RATIO
Creatinine,U: 76.1 mg/dL
Microalb Creat Ratio: 11 mg/g (ref 0.0–30.0)
Microalb, Ur: 0.8 mg/dL (ref 0.7–1.9)

## 2024-12-18 LAB — HEMOGLOBIN A1C: Hgb A1c MFr Bld: 5.6 % (ref 4.6–6.5)

## 2024-12-18 LAB — TSH: TSH: 0.43 u[IU]/mL (ref 0.35–5.50)

## 2024-12-18 LAB — LDL CHOLESTEROL, DIRECT: Direct LDL: 212 mg/dL

## 2024-12-18 NOTE — Assessment & Plan Note (Signed)

## 2024-12-19 LAB — URINE CULTURE
MICRO NUMBER:: 17496861
SPECIMEN QUALITY:: ADEQUATE

## 2024-12-21 ENCOUNTER — Ambulatory Visit: Payer: Self-pay | Admitting: Internal Medicine

## 2024-12-21 DIAGNOSIS — N3000 Acute cystitis without hematuria: Secondary | ICD-10-CM

## 2024-12-21 MED ORDER — CIPROFLOXACIN HCL 250 MG PO TABS: 250.0000 mg | ORAL_TABLET | Freq: Two times a day (BID) | ORAL | 0 refills | Status: AC

## 2024-12-21 NOTE — Assessment & Plan Note (Signed)
 E Coli pan sensitive.  Cipro  prescribed.  Daily use of a probiotic advised for 3 weeks.

## 2024-12-23 LAB — GENECONNECT MOLECULAR SCREEN: Genetic Analysis Overall Interpretation: NEGATIVE

## 2025-01-02 LAB — COLOGUARD: COLOGUARD: NEGATIVE

## 2025-01-13 ENCOUNTER — Ambulatory Visit: Admitting: Urology

## 2025-06-18 ENCOUNTER — Ambulatory Visit: Admitting: Internal Medicine
